# Patient Record
Sex: Female | Born: 2000 | Race: Black or African American | Hispanic: No | Marital: Single | Smoking: Never smoker
Health system: Southern US, Community
[De-identification: ages and names within clinical notes are randomized; demographics above are authoritative.]

## PROBLEM LIST (undated history)

## (undated) DIAGNOSIS — F32A Depression, unspecified: Secondary | ICD-10-CM

## (undated) DIAGNOSIS — K3184 Gastroparesis: Secondary | ICD-10-CM

## (undated) DIAGNOSIS — F410 Panic disorder [episodic paroxysmal anxiety] without agoraphobia: Secondary | ICD-10-CM

## (undated) DIAGNOSIS — F329 Major depressive disorder, single episode, unspecified: Secondary | ICD-10-CM

## (undated) DIAGNOSIS — L309 Dermatitis, unspecified: Secondary | ICD-10-CM

## (undated) DIAGNOSIS — R51 Headache: Secondary | ICD-10-CM

## (undated) DIAGNOSIS — T7840XA Allergy, unspecified, initial encounter: Secondary | ICD-10-CM

## (undated) HISTORY — DX: Dermatitis, unspecified: L30.9

## (undated) HISTORY — PX: NO PAST SURGERIES: SHX2092

## (undated) HISTORY — DX: Headache: R51

## (undated) HISTORY — DX: Gastroparesis: K31.84

---
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
## 2018-02-07 ENCOUNTER — Encounter (HOSPITAL_COMMUNITY): Payer: Self-pay | Admitting: Emergency Medicine

## 2018-02-07 ENCOUNTER — Other Ambulatory Visit: Payer: Self-pay

## 2018-02-07 ENCOUNTER — Emergency Department (HOSPITAL_COMMUNITY)
Admission: EM | Admit: 2018-02-07 | Discharge: 2018-02-07 | Disposition: A | Discharge status: Home / Self Care | Payer: Medicaid Other | Attending: Emergency Medicine | Admitting: Emergency Medicine

## 2018-02-07 DIAGNOSIS — Z7722 Contact with and (suspected) exposure to environmental tobacco smoke (acute) (chronic): Secondary | ICD-10-CM | POA: Insufficient documentation

## 2018-02-07 DIAGNOSIS — Z79899 Other long term (current) drug therapy: Secondary | ICD-10-CM | POA: Diagnosis not present

## 2018-02-07 DIAGNOSIS — R51 Headache: Secondary | ICD-10-CM | POA: Insufficient documentation
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
## 2018-02-07 NOTE — ED Notes (Signed)
Pt's boyfriend was asked to step out of the room so the Dr could interview the patient because he was answering all the questions. Pt and the boyfriend both decided to leave.

## 2018-02-07 NOTE — ED Provider Notes (Deleted)
[facility]-EMERGENCY DEPT Provider Note: Lowella Dell, MD, FACEP  CSN: [id number] MRN: [id number] ARRIVAL: 02/07/18 at 2056 ROOM: WA18/WA18   CHIEF COMPLAINT  Head Injury and Hand Injury   HISTORY OF PRESENT ILLNESS  02/07/18 11:29 PM [name] is a 17 y.o. female    Past Medical History:  Diagnosis Date  . Allergy   . Depression   . Headache(784.0)   . Panic attack     Past Surgical History:  Procedure Laterality Date  . NO PAST SURGERIES      Family History  Problem Relation Age of Onset  . Migraines Mother   . Migraines Maternal Grandmother     Social History   Tobacco Use  . Smoking status: Passive Smoke Exposure - Never Smoker  . Smokeless tobacco: Never Used  Substance Use Topics  . Alcohol use: No  . Drug use: Yes    Types: Marijuana    Comment: last used 09/31/2018    Prior to Admission medications   Medication Sig Start Date End Date Taking? Authorizing Provider  cyclobenzaprine (FLEXERIL) 10 MG tablet Take 1 tablet (10 mg total) by mouth 2 (two) times daily as needed for muscle spasms. 12/04/17  Yes Derwood Kaplan, MD  hydrOXYzine (ATARAX/VISTARIL) 25 MG tablet Take 25 mg by mouth daily as needed (sleep).   Yes [provider]  ibuprofen (ADVIL,MOTRIN) 400 MG tablet Take 1 tablet (400 mg total) by mouth every 6 (six) hours as needed. Patient not taking: Reported on 02/07/2018 12/04/17   Derwood Kaplan, MD  nitrofurantoin, macrocrystal-monohydrate, (MACROBID) 100 MG capsule Take 1 capsule (100 mg total) by mouth 2 (two) times daily. Patient not taking: Reported on 02/07/2018 12/04/17   Derwood Kaplan, MD  phenazopyridine (PYRIDIUM) 200 MG tablet Take 1 tablet (200 mg total) by mouth 3 (three) times daily. Patient not taking: Reported on 02/07/2018 12/04/17   Derwood Kaplan, MD  QUEtiapine (SEROQUEL) 100 MG tablet Take 1 tablet (100 mg total) by mouth at bedtime. Patient not taking: Reported on 02/07/2018 04/16/15   Thedora Hinders,  MD  valACYclovir (VALTREX) 1000 MG tablet Take 1 tablet (1,000 mg total) by mouth 2 (two) times daily. Patient not taking: Reported on 02/07/2018 03/20/17   Thressa Sheller D, CNM    Allergies Latex   REVIEW OF SYSTEMS  Negative except as noted here or in the History of Present Illness.   PHYSICAL EXAMINATION  Initial Vital Signs Blood pressure (!) 132/105, pulse 71, temperature 98.2 F (36.8 C), temperature source Oral, resp. rate 17, last menstrual period 02/02/2018, SpO2 100 %.  Examination General: Well-developed, well-nourished female in no acute distress; appearance consistent with age of record HENT: normocephalic; atraumatic Eyes: pupils equal, round and reactive to light; extraocular muscles intact Neck: supple Heart: regular rate and rhythm; no murmurs, rubs or gallops Lungs: clear to auscultation bilaterally Abdomen: soft; nondistended; nontender; no masses or hepatosplenomegaly; bowel sounds present Extremities: No deformity; full range of motion; pulses normal Neurologic: Awake, alert and oriented; motor function intact in all extremities and symmetric; no facial droop Skin: Warm and dry Psychiatric: Normal mood and affect   RESULTS  Summary of this visit's results, reviewed by myself:   EKG Interpretation  Date/Time:    Ventricular Rate:    PR Interval:    QRS Duration:   QT Interval:    QTC Calculation:   R Axis:     Text Interpretation:        Laboratory Studies: No results found for this  or any previous visit (from the past 24 hour(s)). Imaging Studies: No results found.  ED COURSE and MDM  Nursing notes and initial vitals signs, including pulse oximetry, reviewed.  Vitals:   02/07/18 2150  BP: (!) 132/105  Pulse: 71  Resp: 17  Temp: 98.2 F (36.8 C)  TempSrc: Oral  SpO2: 100%    PROCEDURES    ED DIAGNOSES  No diagnosis found.     [name], Shawnie Nicole, MD 02/07/18 [phone]

## 2018-02-09 ENCOUNTER — Emergency Department (HOSPITAL_COMMUNITY)
Admission: EM | Admit: 2018-02-09 | Discharge: 2018-02-09 | Disposition: A | Discharge status: Home / Self Care | Payer: Medicaid Other | Attending: Emergency Medicine | Admitting: Emergency Medicine

## 2018-02-09 ENCOUNTER — Emergency Department (HOSPITAL_COMMUNITY): Payer: Medicaid Other

## 2018-02-09 ENCOUNTER — Encounter (HOSPITAL_COMMUNITY): Payer: Self-pay | Admitting: Emergency Medicine

## 2018-02-09 DIAGNOSIS — Z91040 Latex allergy status: Secondary | ICD-10-CM | POA: Diagnosis not present

## 2018-02-09 DIAGNOSIS — Z7722 Contact with and (suspected) exposure to environmental tobacco smoke (acute) (chronic): Secondary | ICD-10-CM | POA: Diagnosis not present

## 2018-02-09 DIAGNOSIS — S060X0A Concussion without loss of consciousness, initial encounter: Secondary | ICD-10-CM

## 2018-02-09 DIAGNOSIS — R51 Headache: Secondary | ICD-10-CM | POA: Insufficient documentation

## 2018-02-09 MED ORDER — KETOROLAC TROMETHAMINE 30 MG/ML IJ SOLN
15.0000 mg | Freq: Once | INTRAMUSCULAR | Status: AC
  Administered 2018-02-09: 15 mg via INTRAMUSCULAR
  Filled 2018-02-09: qty 1
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
## 2018-07-01 ENCOUNTER — Other Ambulatory Visit: Payer: Self-pay

## 2018-07-01 ENCOUNTER — Emergency Department (HOSPITAL_COMMUNITY)
Admission: EM | Admit: 2018-07-01 | Discharge: 2018-07-02 | Disposition: A | Discharge status: Home / Self Care | Payer: Medicaid Other | Attending: Emergency Medicine | Admitting: Emergency Medicine

## 2018-07-01 ENCOUNTER — Encounter (HOSPITAL_COMMUNITY): Payer: Self-pay | Admitting: Emergency Medicine

## 2018-07-01 DIAGNOSIS — Z7722 Contact with and (suspected) exposure to environmental tobacco smoke (acute) (chronic): Secondary | ICD-10-CM | POA: Insufficient documentation

## 2018-07-01 DIAGNOSIS — R45850 Homicidal ideations: Secondary | ICD-10-CM | POA: Diagnosis not present

## 2018-07-01 DIAGNOSIS — F4325 Adjustment disorder with mixed disturbance of emotions and conduct: Secondary | ICD-10-CM | POA: Diagnosis present

## 2018-07-01 DIAGNOSIS — F919 Conduct disorder, unspecified: Secondary | ICD-10-CM | POA: Diagnosis not present

## 2018-07-01 DIAGNOSIS — F314 Bipolar disorder, current episode depressed, severe, without psychotic features: Secondary | ICD-10-CM | POA: Insufficient documentation

## 2018-07-01 DIAGNOSIS — Z7289 Other problems related to lifestyle: Secondary | ICD-10-CM | POA: Diagnosis not present

## 2018-07-01 DIAGNOSIS — R4689 Other symptoms and signs involving appearance and behavior: Secondary | ICD-10-CM

## 2018-07-01 LAB — COMPREHENSIVE METABOLIC PANEL
ALBUMIN: 5.1 g/dL — AB (ref 3.5–5.0)
ALT: 15 U/L (ref 0–44)
AST: 21 U/L (ref 15–41)
Alkaline Phosphatase: 55 U/L (ref 38–126)
Anion gap: 9 (ref 5–15)
BUN: 12 mg/dL (ref 6–20)
CHLORIDE: 108 mmol/L (ref 98–111)
CO2: 24 mmol/L (ref 22–32)
CREATININE: 0.72 mg/dL (ref 0.44–1.00)
Calcium: 9.6 mg/dL (ref 8.9–10.3)
GFR calc Af Amer: >60 mL/min (ref >60)
Glucose, Bld: 96 mg/dL (ref 70–99)
POTASSIUM: 3.6 mmol/L (ref 3.5–5.1)
SODIUM: 141 mmol/L (ref 135–145)
Total Bilirubin: 0.7 mg/dL (ref 0.3–1.2)
Total Protein: 8.3 g/dL — ABNORMAL HIGH (ref 6.5–8.1)

## 2018-07-01 LAB — I-STAT BETA HCG BLOOD, ED (MC, WL, AP ONLY): I-stat hCG, quantitative: <5 m[IU]/mL (ref <5)

## 2018-07-01 LAB — CBC
HEMATOCRIT: 39.4 % (ref 36.0–46.0)
HEMOGLOBIN: 13.2 g/dL (ref 12.0–15.0)
MCH: 24.3 pg — AB (ref 26.0–34.0)
MCHC: 33.5 g/dL (ref 30.0–36.0)
MCV: 72.4 fL — AB (ref 80.0–100.0)
Platelets: 290 10*3/uL (ref 150–400)
RBC: 5.44 MIL/uL — ABNORMAL HIGH (ref 3.87–5.11)
RDW: 17 % — ABNORMAL HIGH (ref 11.5–15.5)
WBC: 7.7 10*3/uL (ref 4.0–10.5)
nRBC: 0 % (ref 0.0–0.2)

## 2018-07-01 MED ORDER — HYDROXYZINE HCL 25 MG PO TABS
50.0000 mg | ORAL_TABLET | Freq: Every day | ORAL | Status: DC
  Administered 2018-07-01: 50 mg via ORAL
  Filled 2018-07-01: qty 2

## 2018-07-01 NOTE — BH Assessment (Addendum)
Assessment Note  Nina [name] is an 18 y.o. female that presents this date with IVC. Per IVC: "Respondent has been diagnosed with Bipolar Disorder. Respondent has ben prescribed medications but is currently non compliant. Respondent took pills yesterday in a attempt to overdose and attempted to run into traffic. Respondent is a danger to herself and others." Patient admits to S/I this date reporting she attempted self harm yesterday 06/30/18 by taking medications. Patient will not elaborate on what medications she ingested or where she obtained those medications. Patient is observed to be angry and speaks in a loud voice as this Clinical research associate attempts to assess. Patient denies any H/I or AVH. Patient denies currently having a OP provider or being on any medications at this time. Patient reports the last time she was prescribed medications was in 2018 when she was receiving services from [facility]. Patient denies any current mental health diagnosis or symptoms. Per note review patient was last assessed in 2018 at [facility] when at that time her legal guardian was her grandmother Nina [name] [phone]. Patient states this date her she no longer has a guardian. Per notes patient has a history of PTSD although denies this date. This Clinical research associate attempted to contact Nina [name] this date unsuccessfully. Patient denies any SA history although per chart review has a history of cocaine and opiate use. Patient's UDS is pending this date. Patient is oriented x 4 and provides limited history.  Information to complete assessment was obtained from admission notes and history. Per notes patient has a history of bipolar disorder presenting under IVC. Patient reports she does not know why she is here, and that IVC paperwork has been taken out inappropriately on her. Patient reports she has not slept in the last couple days, but otherwise denies any problems with sleep. When asked if she has any thoughts of self-harm or has  attempted self-harm she states "I am just going to tell you what ever that makes me get out of here the quickest". She denies hitting her head or attempting to run out in traffic. Case was staffed with Nina Pollack DNP who recommended a inpatient admission to assist with stabilization.     Diagnosis: F31.4 Bipolar I disorder, current or most recent episode depressed (per notes)   Past Medical History:  Past Medical History:  Diagnosis Date  . Allergy   . Depression   . Headache(784.0)   . Panic attack     Past Surgical History:  Procedure Laterality Date  . NO PAST SURGERIES      Family History:  Family History  Problem Relation Age of Onset  . Migraines Mother   . Migraines Maternal Grandmother     Social History:  reports that she is a non-smoker but has been exposed to tobacco smoke. She has never used smokeless tobacco. She reports current drug use. Drug: Marijuana. She reports that she does not drink alcohol.  Additional Social History:  Alcohol / Drug Use Pain Medications: see MAR Prescriptions: see MAR Over the Counter: see MAR History of alcohol / drug use?: Yes Longest period of sobriety (when/how long): Unknown  Negative Consequences of Use: (Pt denies) Withdrawal Symptoms: (Pt denies) Substance #1 Name of Substance 1: Cocaine per hx 1 - Age of First Use: 17 1 - Amount (size/oz): Pt denies current use 1 - Frequency: Pt denies current use 1 - Duration: Pt denies current use 1 - Last Use / Amount: Pt denies current use UDS pending  CIWA: CIWA-Ar BP: Marland Kitchen)  122/92 Pulse Rate: 66 COWS:    Allergies:  Allergies  Allergen Reactions  . Latex Rash    Home Medications: (Not in a hospital admission)   OB/GYN Status:  No LMP recorded.  General Assessment Data Location of Assessment: [facility] ED TTS Assessment: In system Is this a Tele or Face-to-Face Assessment?: Face-to-Face Is this an Initial Assessment or a Re-assessment for this encounter?: Initial Assessment Patient  Accompanied by:: N/A Language Other than English: No Living Arrangements: Other (Comment)(Alone) What gender do you identify as?: Female Marital status: Single Maiden name: Sisemore Pregnancy Status: No Living Arrangements: Alone Can pt return to current living arrangement?: Yes Admission Status: Involuntary Petitioner: Family member Is patient capable of signing voluntary admission?: Yes Referral Source: Self/Family/Friend Insurance type: Medicaid     Crisis Care Plan Living Arrangements: Alone Legal Guardian: (NA) Name of Psychiatrist: Pt denies Name of Therapist: None   Education Status Is patient currently in school?: No Is the patient employed, unemployed or receiving disability?: Unemployed  Risk to self with the past 6 months Suicidal Ideation: Yes-Currently Present Has patient been a risk to self within the past 6 months prior to admission? : No Suicidal Intent: Yes-Currently Present Has patient had any suicidal intent within the past 6 months prior to admission? : No Is patient at risk for suicide?: Yes Suicidal Plan?: (Pt denies) Has patient had any suicidal plan within the past 6 months prior to admission? : No Access to Means: (Pt denies) What has been your use of drugs/alcohol within the last 12 months?: Denies current use Previous Attempts/Gestures: No(Per chart) How many times?: 0(Per chart) Other Self Harm Risks: (Non compliant with medications ) Triggers for Past Attempts: Unknown Intentional Self Injurious Behavior: None Family Suicide History: No Recent stressful life event(s): Other (Comment)(Homeless) Persecutory voices/beliefs?: No Depression: (Pt denies) Depression Symptoms: (Pt denies) Substance abuse history and/or treatment for substance abuse?: No Suicide prevention information given to non-admitted patients: Not applicable  Risk to Others within the past 6 months Homicidal Ideation: No Does patient have any lifetime risk of violence toward  others beyond the six months prior to admission? : No Thoughts of Harm to Others: No Current Homicidal Intent: No Current Homicidal Plan: No Access to Homicidal Means: No Identified Victim: NA History of harm to others?: No Assessment of Violence: None Noted Violent Behavior Description: NA Does patient have access to weapons?: No Criminal Charges Pending?: No Does patient have a court date: No Is patient on probation?: No  Psychosis Hallucinations: None noted Delusions: None noted  Mental Status Report Appearance/Hygiene: In scrubs Eye Contact: Fair Motor Activity: Freedom of movement Speech: Pressured, Loud Level of Consciousness: Irritable Mood: Angry Affect: Threatening Anxiety Level: Moderate Thought Processes: Coherent, Relevant Judgement: Partial Orientation: Person, Place, Time, Situation Obsessive Compulsive Thoughts/Behaviors: None  Cognitive Functioning Concentration: Decreased Memory: Recent Intact, Remote Intact Is patient IDD: No Insight: Poor Impulse Control: Poor Appetite: Fair Have you had any weight changes? : No Change Sleep: No Change Total Hours of Sleep: 6 Vegetative Symptoms: None  ADLScreening [facility] Assessment Services) Patient's cognitive ability adequate to safely complete daily activities?: Yes Patient able to express need for assistance with ADLs?: Yes Independently performs ADLs?: Yes (appropriate for developmental age)  Prior Inpatient Therapy Prior Inpatient Therapy: Yes Prior Therapy Dates: 2018  Prior Therapy Facilty/Provider(s[facility] Reason for Treatment: MH issues  Prior Outpatient Therapy Prior Outpatient Therapy: Yes Prior Therapy Dates: 2018 Prior Therapy Facilty/Provider(s): [facility] Reason for Treatment: Med mang Does patient have  an ACCT team?: No Does patient have Intensive In-House Services?  : No Does patient have [facility] services? : No Does patient have P4CC services?: No  ADL Screening (condition at time  of admission) Patient's cognitive ability adequate to safely complete daily activities?: Yes Is the patient deaf or have difficulty hearing?: No Does the patient have difficulty seeing, even when wearing glasses/contacts?: No Does the patient have difficulty concentrating, remembering, or making decisions?: No Patient able to express need for assistance with ADLs?: Yes Does the patient have difficulty dressing or bathing?: No Independently performs ADLs?: Yes (appropriate for developmental age) Does the patient have difficulty walking or climbing stairs?: No Weakness of Legs: None Weakness of Arms/Hands: None  Home Assistive Devices/Equipment Home Assistive Devices/Equipment: None  Therapy Consults (therapy consults require a physician order) PT Evaluation Needed: No OT Evalulation Needed: No SLP Evaluation Needed: No Abuse/Neglect Assessment (Assessment to be complete while patient is alone) Physical Abuse: Denies Verbal Abuse: Denies Sexual Abuse: Denies Exploitation of patient/patient's resources: Denies Self-Neglect: Denies Values / Beliefs Cultural Requests During Hospitalization: None Spiritual Requests During Hospitalization: None Consults Spiritual Care Consult Needed: No Social Work Consult Needed: No Merchant navy officerAdvance Directives (For Healthcare) Does Patient Have a Medical Advance Directive?: No Would patient like information on creating a medical advance directive?: No - Patient declined          Disposition: Case was staffed with Nina PollackLord DNP who recommended a inpatient admission to assist with stabilization. Disposition Initial Assessment Completed for this Encounter: Yes Disposition of Patient: Admit Type of inpatient treatment program: Adult Patient refused recommended treatment: Yes Type of treatment offered and refused: In-patient Mode of transportation if patient is discharged/movement?: Loreli Slot(UNK)  On Site Evaluation by:   Reviewed with Physician:    Alfredia Fergusonavid L  Soyla Bainter 07/01/2018 6:23 PM

## 2018-07-01 NOTE — ED Triage Notes (Signed)
Pt BIB police IVC d related to taking pills yesterday and running out in front of traffic today; noncompliant with medications; hx of bipolar disorder and depression.

## 2018-07-01 NOTE — ED Provider Notes (Signed)
[facility]-EMERGENCY DEPT Provider Note   CSN: [id number] Arrival date & time: 07/01/18  1540     History   Chief Complaint Chief Complaint  Patient presents with  . IVC    HPI Nina [name] is a 18 y.o. female.  HPI   Patient is a 18 year old female with a history of bipolar disorder, headaches, PTSD presenting under IVC.  Patient reports she does not know why she is here, and that IVC paperwork has been taken out inappropriately on her.  She reports that she does not know her grandmother knew about "information she confided in someone with", but states that her grandmother is the one that took out the IVC paperwork.  She reports that she took 100 mg of hydroxyzine at bedtime 2 nights ago, whereas her typical dose is 50 mg of hydroxyzine as needed at night.  Patient reports she has not slept in the last couple days, but otherwise denies any problems with sleep.  When asked if she has any thoughts of self-harm or has attempted self-harm she states "I am just cannot tell you what ever skin to make me get out here the quickest".  She denies hitting her head or attempting to run out in traffic.  She denies AVH.  She reports that she smokes marijuana but denies any tobacco use, alcohol use, or illicit drug use.  Patient reports she has a psychiatrist named Dr. Shela CommonsJ in [place].  She denies taking her Seroquel in 2 years.  Past Medical History:  Diagnosis Date  . Allergy   . Depression   . Headache(784.0)   . Panic attack     Patient Active Problem List   Diagnosis Date Noted  . Bipolar disorder (HCC) 04/10/2015  . ODD (oppositional defiant disorder) 01/26/2014  . PTSD (post-traumatic stress disorder) 01/22/2014  . MDD (major depressive disorder), recurrent episode, severe (HCC) 01/22/2014  . Homicidal ideation 01/22/2014  . Tension headache 11/06/2012  . Migraine headache without aura 11/06/2012  . Insomnia 11/06/2012    Past Surgical History:  Procedure  Laterality Date  . NO PAST SURGERIES       OB History   No obstetric history on file.      Home Medications    Prior to Admission medications   Medication Sig Start Date End Date Taking? Authorizing Provider  cyclobenzaprine (FLEXERIL) 10 MG tablet Take 1 tablet (10 mg total) by mouth 2 (two) times daily as needed for muscle spasms. Patient not taking: Reported on 02/09/2018 12/04/17   Derwood KaplanNanavati, Ankit, MD  ibuprofen (ADVIL,MOTRIN) 400 MG tablet Take 1 tablet (400 mg total) by mouth every 6 (six) hours as needed. Patient not taking: Reported on 02/07/2018 12/04/17   Derwood KaplanNanavati, Ankit, MD  nitrofurantoin, macrocrystal-monohydrate, (MACROBID) 100 MG capsule Take 1 capsule (100 mg total) by mouth 2 (two) times daily. Patient not taking: Reported on 02/07/2018 12/04/17   Derwood KaplanNanavati, Ankit, MD  phenazopyridine (PYRIDIUM) 200 MG tablet Take 1 tablet (200 mg total) by mouth 3 (three) times daily. Patient not taking: Reported on 02/07/2018 12/04/17   Derwood KaplanNanavati, Ankit, MD  QUEtiapine (SEROQUEL) 100 MG tablet Take 1 tablet (100 mg total) by mouth at bedtime. Patient not taking: Reported on 02/07/2018 04/16/15   Thedora HindersSevilla Saez-Benito, Miriam, MD  valACYclovir (VALTREX) 1000 MG tablet Take 1 tablet (1,000 mg total) by mouth 2 (two) times daily. Patient not taking: Reported on 02/07/2018 03/20/17   Armando ReichertHogan, Heather D, CNM    Family History Family History  Problem Relation  Age of Onset  . Migraines Mother   . Migraines Maternal Grandmother     Social History Social History   Tobacco Use  . Smoking status: Passive Smoke Exposure - Never Smoker  . Smokeless tobacco: Never Used  Substance Use Topics  . Alcohol use: No  . Drug use: Yes    Types: Marijuana    Comment: last used 09/31/2018     Allergies   Latex   Review of Systems Review of Systems  Musculoskeletal: Negative for arthralgias.  Skin: Negative for wound.  Psychiatric/Behavioral: Positive for agitation and sleep disturbance. Negative for  dysphoric mood, hallucinations, self-injury and suicidal ideas. The patient is not nervous/anxious.   All other systems reviewed and are negative.    Physical Exam Updated Vital Signs BP (!) 122/92 (BP Location: Right Arm)   Pulse 66   Temp 98.2 F (36.8 C) (Oral)   Resp 19   SpO2 100%   Physical Exam Vitals signs and nursing note reviewed.  Constitutional:      General: She is not in acute distress.    Appearance: She is well-developed. She is not ill-appearing.  HENT:     Head: Normocephalic and atraumatic.     Comments: No evidence of trauma on forehead.  No battle sign.    Nose: Nose normal.  Eyes:     Extraocular Movements: Extraocular movements intact.     Conjunctiva/sclera: Conjunctivae normal.     Pupils: Pupils are equal, round, and reactive to light.  Neck:     Musculoskeletal: Normal range of motion and neck supple.  Cardiovascular:     Rate and Rhythm: Normal rate and regular rhythm.     Heart sounds: S1 normal and S2 normal. No murmur.  Pulmonary:     Effort: Pulmonary effort is normal.     Breath sounds: Normal breath sounds. No wheezing or rales.  Abdominal:     General: There is no distension.     Palpations: Abdomen is soft.     Tenderness: There is no abdominal tenderness. There is no guarding.  Musculoskeletal: Normal range of motion.        General: No deformity.  Lymphadenopathy:     Cervical: No cervical adenopathy.  Skin:    General: Skin is warm and dry.     Findings: No erythema or rash.  Neurological:     Mental Status: She is alert.     Comments: Cranial nerves grossly intact. Patient moves extremities symmetrically and with good coordination.  Psychiatric:     Comments: Alert and oriented x4.  Labile affect.  Appears agitated at times.  Normal speech.  Overall appropriate thought content and judgment, however begins to speak rapidly when informed that she would need to say overnight.       ED Treatments / Results  Labs (all labs  ordered are listed, but only abnormal results are displayed) Labs Reviewed  COMPREHENSIVE METABOLIC PANEL - Abnormal; Notable for the following components:      Result Value   Total Protein 8.3 (*)    Albumin 5.1 (*)    All other components within normal limits  CBC - Abnormal; Notable for the following components:   RBC 5.44 (*)    MCV 72.4 (*)    MCH 24.3 (*)    RDW 17.0 (*)    All other components within normal limits  ETHANOL  SALICYLATE LEVEL  ACETAMINOPHEN LEVEL  RAPID URINE DRUG SCREEN, HOSP PERFORMED  I-STAT BETA HCG BLOOD, ED ([facility],  [facility], [facility] ONLY)    EKG None  Radiology No results found.  Procedures Procedures (including critical care time)  Medications Ordered in ED Medications  hydrOXYzine (ATARAX/VISTARIL) tablet 50 mg (has no administration in time range)     Initial Impression / Assessment and Plan / ED Course  I have reviewed the triage vital signs and the nursing notes.  Pertinent labs & imaging results that were available during my care of the patient were reviewed by me and considered in my medical decision making (see chart for details).  Clinical Course as of Jul 01 1900  Sheral Flow Jul 01, 2018  1725 Attempted to reach patient's grandmother for collateral information. Unable to reach. Will re-attempt.    [AM]  1859 Unable to reach grandmother for collateral information x2.   [AM]  1859 Total Protein(!): 8.3 [AM]    Clinical Course User Index [AM] Elisha Ponder, PA-C   Patient nontoxic-appearing, overall cooperative and in no acute distress.  Patient under IVC. Patient does exhibit rapid speech at times, question mania.  Patient is largely uncooperative during examination.  An attempt was made to contact patient's grandmother for more collateral information.  Will complete first exam with attending physician.   The IVC report has statement that patient was attempting to run onto traffic, hitting her head on the wall, possibly with "bleeding to the  forehead".  This does not correlate on exam.  Attempted to contact patient's grandmother x2 to get collateral information.  Patient is neurologically intact here today.  Do not feel that intracranial hemorrhage likely.  5:32 PM Patient cleared to speak with TTS at this time. Labs pending.  Lab work demonstrating hyperproteinemia of 8.3.  MCV is 72.4, normal hemoglobin.  Consult to case management placed to assist with follow-up at [facility].  7:22 PM Recommendation per Shaune Pollack, FNP is inpatient. Appreciate their involvement.   Final Clinical Impressions(s) / ED Diagnoses   Final diagnoses:  Aggressive behavior  Self-injurious behavior    ED Discharge Orders    None       Delia Chimes 07/01/18 Reece Agar, MD 07/01/18 2303

## 2018-07-01 NOTE — BH Assessment (Signed)
[facility] Assessment Progress Note Case was staffed with Lord DNP who recommended a inpatient admission to assist with stabilization.       

## 2018-07-01 NOTE — ED Notes (Signed)
Pt changed into purple scrubs. Will not allow any earrings to be removed. Stated, "You will have to knock me out to take those out."

## 2018-07-01 NOTE — ED Notes (Signed)
Pt staying in her room. Irritable, but not being a problem. Allowed blood draw.  Waiting for urine.

## 2018-07-01 NOTE — ED Notes (Signed)
Pt oriented to room and unit.  Pt is calm and cooperative.  She was tearful at first but she opened up pretty quickly.  15 minute checks and video monitoring in place.

## 2018-07-02 DIAGNOSIS — F4325 Adjustment disorder with mixed disturbance of emotions and conduct: Secondary | ICD-10-CM | POA: Diagnosis present

## 2018-07-02 LAB — RAPID URINE DRUG SCREEN, HOSP PERFORMED
AMPHETAMINES: NOT DETECTED
BARBITURATES: NOT DETECTED
BENZODIAZEPINES: NOT DETECTED
Cocaine: NOT DETECTED
Opiates: NOT DETECTED
Tetrahydrocannabinol: POSITIVE — AB

## 2018-07-02 NOTE — ED Notes (Signed)
Patient reminded that a urine sample was needed.  Urine cup at bedside.  Patient said she didn't need to urinate at this time.

## 2018-07-02 NOTE — [facility] Counselor (Signed)
TTS attempted to contact patient's collateral contact, [place] [phone], however she did not pick up and mailbox was full.

## 2018-07-02 NOTE — ED Notes (Signed)
Initially on first contact with this writer she was irritable and requesting something to help her sleep. She has a blanket around her and covering most of her head. Once writer responded to her with an affirmative response, she calmed and was pleasant and cooperative. She took her sleeping medication at 2100 and went to bed where she has been and asleep since shortly after 2100. No behavior issues, will continue to monitor for safety.

## 2018-07-02 NOTE — [facility] Counselor (Signed)
TTS obtained collateral information from patient's [facility] with consent of patient [phone]. Aunt feels that "the whole situation was blown out of proportion and she does not need to be in the hospital." She states that patient and her grandmother are feuding and there may have been some ulterior motive for taking out IVC. She states that patient does struggle with anger and needs to continue therapy and medications, but does not believe she is a danger to herself or others. She believes that some family egg her anger on when she is asking to be left alone. She states that she see patient on a daily basis and stated she could stay with her or call her whenever she needs to.

## 2018-07-02 NOTE — Discharge Instructions (Signed)
For your behavioral health needs you are advised to follow up with [facility].  New patients are seen at their walk-in clinic.  Walk-in hours are Monday - Friday from 8:00 am - 12:00 pm, and from 1:00 pm - 3:00 pm.  Walk-in patients are seen on a first come, first served basis, so try to arrive as early as possible for the best chance of being seen the same day.  There is an initial fee of $22.50: ° °     [facility] °     [address] °     [place], [place] [address] °     [phone] °

## 2018-07-02 NOTE — ED Notes (Signed)
Patient now talking quietly on the phone asking if she is going home or not.
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
## 2018-07-02 NOTE — ED Notes (Addendum)
Patient awake, alert walking around the unit calm and cooperative.  No bizarre behavior noted.

## 2018-07-02 NOTE — BH Assessment (Signed)
[facility] Assessment Progress Note  Per Juanetta Beets, DO, this pt does not require psychiatric hospitalization at this time.  Pt presents under IVC initiated by pt's grandmother, which Dr Sharma Covert has rescinded.  Pt is to be discharged from [facility] with recommendation to follow up with [facility].  This has been included in pt's discharge instructions.  Pt's nurse, Aram Beecham, has been notified.  Doylene Canning, MA Triage Specialist [phone]
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
## 2018-07-03 ENCOUNTER — Telehealth: Payer: Self-pay | Admitting: *Deleted

## 2018-07-03 NOTE — Telephone Encounter (Signed)
ED CM attempted to reach the patient to ensure she has a f/u appointment with a PCP. Unable to reach the patient. A woman answered and stated she will give Nina [name] the telephone number and have her return the call. Rubie Maidrystal Aliya Sol RN CCM

## 2018-12-07 ENCOUNTER — Other Ambulatory Visit: Payer: Self-pay

## 2018-12-07 ENCOUNTER — Emergency Department (HOSPITAL_COMMUNITY)
Admission: EM | Admit: 2018-12-07 | Discharge: 2018-12-07 | Disposition: A | Discharge status: Home / Self Care | Payer: Medicaid Other | Attending: Emergency Medicine | Admitting: Emergency Medicine

## 2018-12-07 DIAGNOSIS — Z3A01 Less than 8 weeks gestation of pregnancy: Secondary | ICD-10-CM | POA: Insufficient documentation

## 2018-12-07 DIAGNOSIS — Z91040 Latex allergy status: Secondary | ICD-10-CM | POA: Diagnosis not present

## 2018-12-07 DIAGNOSIS — O26891 Other specified pregnancy related conditions, first trimester: Secondary | ICD-10-CM | POA: Insufficient documentation

## 2018-12-07 DIAGNOSIS — N898 Other specified noninflammatory disorders of vagina: Secondary | ICD-10-CM | POA: Insufficient documentation

## 2018-12-07 DIAGNOSIS — Y92488 Other paved roadways as the place of occurrence of the external cause: Secondary | ICD-10-CM | POA: Insufficient documentation

## 2018-12-07 DIAGNOSIS — F1210 Cannabis abuse, uncomplicated: Secondary | ICD-10-CM | POA: Insufficient documentation

## 2018-12-07 DIAGNOSIS — Y9389 Activity, other specified: Secondary | ICD-10-CM | POA: Insufficient documentation

## 2018-12-07 DIAGNOSIS — Z7722 Contact with and (suspected) exposure to environmental tobacco smoke (acute) (chronic): Secondary | ICD-10-CM | POA: Diagnosis not present

## 2018-12-07 DIAGNOSIS — R1032 Left lower quadrant pain: Secondary | ICD-10-CM | POA: Diagnosis not present

## 2018-12-07 DIAGNOSIS — Y998 Other external cause status: Secondary | ICD-10-CM | POA: Diagnosis not present

## 2018-12-07 LAB — I-STAT BETA HCG BLOOD, ED (MC, WL, AP ONLY): I-stat hCG, quantitative: >2000 m[IU]/mL — ABNORMAL HIGH (ref <5)

## 2018-12-07 LAB — WET PREP, GENITAL
Clue Cells Wet Prep HPF POC: NONE SEEN
Sperm: NONE SEEN
Trich, Wet Prep: NONE SEEN
Yeast Wet Prep HPF POC: NONE SEEN

## 2018-12-07 LAB — URINALYSIS, ROUTINE W REFLEX MICROSCOPIC
Bilirubin Urine: NEGATIVE
Glucose, UA: NEGATIVE mg/dL
Hgb urine dipstick: NEGATIVE
Ketones, ur: 20 mg/dL — AB
Leukocytes,Ua: NEGATIVE
Nitrite: NEGATIVE
Protein, ur: NEGATIVE mg/dL
Specific Gravity, Urine: 1.02 (ref 1.005–1.030)
pH: 6 (ref 5.0–8.0)
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
## 2018-12-07 NOTE — ED Notes (Signed)
Patient verbalizes understanding of discharge instructions. Opportunity for questioning and answers were provided. Armband removed by staff, pt discharged from ED. Ambulated out to lobby  
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
## 2018-12-07 NOTE — Discharge Instructions (Signed)
Your urine is without infection, your wet prep without BV. Follow up with your OBGYN.  Tylenol for pain.    Return for vaginal bleeding or worsening pain.    WEAR YOUR SEATBELT!

## 2018-12-09 LAB — GC/CHLAMYDIA PROBE AMP (~~LOC~~) NOT AT ARMC
Chlamydia: POSITIVE — AB
Neisseria Gonorrhea: NEGATIVE

## 2019-03-15 ENCOUNTER — Encounter (HOSPITAL_COMMUNITY): Payer: Self-pay

## 2019-03-15 ENCOUNTER — Encounter (HOSPITAL_COMMUNITY): Payer: Self-pay | Admitting: Emergency Medicine

## 2019-03-15 ENCOUNTER — Inpatient Hospital Stay (HOSPITAL_COMMUNITY)
Admission: AD | Admit: 2019-03-15 | Discharge: 2019-03-15 | Discharge status: Against Medical Advice | Payer: Medicaid Other | Source: Home / Self Care | Attending: Obstetrics & Gynecology | Admitting: Obstetrics & Gynecology

## 2019-03-15 ENCOUNTER — Other Ambulatory Visit: Payer: Self-pay

## 2019-03-15 ENCOUNTER — Emergency Department (HOSPITAL_COMMUNITY)
Admission: EM | Admit: 2019-03-15 | Discharge: 2019-03-15 | Disposition: A | Discharge status: Home / Self Care | Payer: Medicaid Other | Attending: Emergency Medicine | Admitting: Emergency Medicine

## 2019-03-15 ENCOUNTER — Emergency Department (HOSPITAL_COMMUNITY): Payer: Medicaid Other

## 2019-03-15 DIAGNOSIS — Z3A20 20 weeks gestation of pregnancy: Secondary | ICD-10-CM | POA: Insufficient documentation

## 2019-03-15 DIAGNOSIS — Z7722 Contact with and (suspected) exposure to environmental tobacco smoke (acute) (chronic): Secondary | ICD-10-CM | POA: Insufficient documentation

## 2019-03-15 DIAGNOSIS — Y999 Unspecified external cause status: Secondary | ICD-10-CM | POA: Insufficient documentation

## 2019-03-15 DIAGNOSIS — S63632A Sprain of interphalangeal joint of right middle finger, initial encounter: Secondary | ICD-10-CM | POA: Diagnosis not present

## 2019-03-15 DIAGNOSIS — T07XXXA Unspecified multiple injuries, initial encounter: Secondary | ICD-10-CM | POA: Diagnosis present

## 2019-03-15 DIAGNOSIS — M545 Low back pain, unspecified: Secondary | ICD-10-CM

## 2019-03-15 DIAGNOSIS — Y939 Activity, unspecified: Secondary | ICD-10-CM | POA: Insufficient documentation

## 2019-03-15 DIAGNOSIS — Y929 Unspecified place or not applicable: Secondary | ICD-10-CM | POA: Insufficient documentation

## 2019-03-15 LAB — URINALYSIS, ROUTINE W REFLEX MICROSCOPIC
Bilirubin Urine: NEGATIVE
Glucose, UA: NEGATIVE mg/dL
Hgb urine dipstick: NEGATIVE
Ketones, ur: 5 mg/dL — AB
Leukocytes,Ua: NEGATIVE
Nitrite: NEGATIVE
Protein, ur: NEGATIVE mg/dL
Specific Gravity, Urine: 1.021 (ref 1.005–1.030)
pH: 6 (ref 5.0–8.0)

## 2019-03-15 NOTE — MAU Note (Signed)
Pt left AMA due to needing to go home and feed her baby. Jorje Guild NP aware. AMA form signed by pt. Pt states,"will follow up with ob doctor regarding this".    Gilmer Mor RN
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
## 2019-03-15 NOTE — ED Notes (Signed)
Transport contacted to get pt to MAU.
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
## 2019-03-15 NOTE — Discharge Instructions (Addendum)
Rest, ice, and buddy tape fingers to restrict movement until pain is less severe Take Tylenol as needed for pain Follow up with your doctor
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
## 2019-03-18 ENCOUNTER — Other Ambulatory Visit: Payer: Self-pay

## 2019-03-18 ENCOUNTER — Inpatient Hospital Stay (HOSPITAL_COMMUNITY)
Admission: AD | Admit: 2019-03-18 | Discharge: 2019-03-18 | Disposition: A | Discharge status: Home / Self Care | Payer: Medicaid Other | Attending: Obstetrics & Gynecology | Admitting: Obstetrics & Gynecology

## 2019-03-18 ENCOUNTER — Encounter (HOSPITAL_COMMUNITY): Payer: Self-pay | Admitting: *Deleted

## 2019-03-18 DIAGNOSIS — Z3492 Encounter for supervision of normal pregnancy, unspecified, second trimester: Secondary | ICD-10-CM

## 2019-03-18 DIAGNOSIS — O26892 Other specified pregnancy related conditions, second trimester: Secondary | ICD-10-CM | POA: Insufficient documentation

## 2019-03-18 DIAGNOSIS — M545 Low back pain: Secondary | ICD-10-CM | POA: Insufficient documentation

## 2019-03-18 DIAGNOSIS — Z3A21 21 weeks gestation of pregnancy: Secondary | ICD-10-CM | POA: Insufficient documentation

## 2019-03-18 DIAGNOSIS — Z7722 Contact with and (suspected) exposure to environmental tobacco smoke (acute) (chronic): Secondary | ICD-10-CM | POA: Diagnosis not present

## 2019-03-18 DIAGNOSIS — R109 Unspecified abdominal pain: Secondary | ICD-10-CM | POA: Insufficient documentation

## 2019-03-18 LAB — URINALYSIS, ROUTINE W REFLEX MICROSCOPIC
Bilirubin Urine: NEGATIVE
Glucose, UA: NEGATIVE mg/dL
Hgb urine dipstick: NEGATIVE
Ketones, ur: NEGATIVE mg/dL
Nitrite: NEGATIVE
Protein, ur: 30 mg/dL — AB
Specific Gravity, Urine: 1.018 (ref 1.005–1.030)
pH: 6 (ref 5.0–8.0)
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
## 2019-03-18 NOTE — Discharge Instructions (Signed)

## 2019-03-19 LAB — CULTURE, OB URINE: Culture: NO GROWTH

## 2019-04-03 ENCOUNTER — Emergency Department (HOSPITAL_COMMUNITY): Payer: Medicaid Other

## 2019-04-03 ENCOUNTER — Other Ambulatory Visit: Payer: Self-pay

## 2019-04-03 ENCOUNTER — Encounter (HOSPITAL_COMMUNITY): Payer: Self-pay

## 2019-04-03 ENCOUNTER — Emergency Department (HOSPITAL_COMMUNITY)
Admission: EM | Admit: 2019-04-03 | Discharge: 2019-04-03 | Disposition: A | Discharge status: Home / Self Care | Payer: Medicaid Other | Attending: Emergency Medicine | Admitting: Emergency Medicine

## 2019-04-03 DIAGNOSIS — Z3A23 23 weeks gestation of pregnancy: Secondary | ICD-10-CM | POA: Diagnosis not present

## 2019-04-03 DIAGNOSIS — Z7722 Contact with and (suspected) exposure to environmental tobacco smoke (acute) (chronic): Secondary | ICD-10-CM | POA: Insufficient documentation

## 2019-04-03 DIAGNOSIS — R0609 Other forms of dyspnea: Secondary | ICD-10-CM | POA: Diagnosis not present

## 2019-04-03 DIAGNOSIS — Z91040 Latex allergy status: Secondary | ICD-10-CM | POA: Insufficient documentation

## 2019-04-03 DIAGNOSIS — Z79899 Other long term (current) drug therapy: Secondary | ICD-10-CM | POA: Insufficient documentation

## 2019-04-03 DIAGNOSIS — O99891 Other specified diseases and conditions complicating pregnancy: Secondary | ICD-10-CM | POA: Insufficient documentation

## 2019-04-03 NOTE — ED Notes (Signed)
Patient transported to X-ray 

## 2019-04-03 NOTE — ED Triage Notes (Signed)
Patient states when she woke this AM around 0900 she began having SOB and feeling like she wanted to cry. Patient breathing normal in triage and texting in triage

## 2019-04-03 NOTE — ED Provider Notes (Signed)
[facility]-EMERGENCY DEPT Provider Note   CSN: [id number] Arrival date & time: 04/03/19  1513     History   Chief Complaint Chief Complaint  Patient presents with  . [redacted] weeks pregnant  . Anxiety  . Shortness of Breath    HPI Nina [name] is a 18 y.o. female.     18 year old female presents with shortness of breath started today.  Patient denies any fever or cough.  Denies severe anxiety.  No exertional chest pain.  No leg pain or swelling.  She denies any vaginal bleeding.  No uterine cramping.  No prior after same.  Symptoms have been persistent since this morning when she woke up.  Nothing makes them better no treatment used prior to arrival.     Past Medical History:  Diagnosis Date  . Allergy   . Depression   . Headache(784.0)   . Panic attack     Patient Active Problem List   Diagnosis Date Noted  . Adjustment disorder with mixed disturbance of emotions and conduct   . Bipolar disorder (HCC) 04/10/2015  . ODD (oppositional defiant disorder) 01/26/2014  . PTSD (post-traumatic stress disorder) 01/22/2014  . MDD (major depressive disorder), recurrent episode, severe (HCC) 01/22/2014  . Homicidal ideation 01/22/2014  . Tension headache 11/06/2012  . Migraine headache without aura 11/06/2012  . Insomnia 11/06/2012    Past Surgical History:  Procedure Laterality Date  . NO PAST SURGERIES       OB History    Gravida  1   Para      Term      Preterm      AB      Living        SAB      TAB      Ectopic      Multiple      Live Births               Home Medications    Prior to Admission medications   Medication Sig Start Date End Date Taking? Authorizing Provider  hydrOXYzine (VISTARIL) 25 MG capsule Take 25-50 mg by mouth at bedtime.    [provider]  prenatal vitamin w/FE, FA (PRENATAL 1 + 1) 27-1 MG TABS tablet Take 1 tablet by mouth daily at 12 noon.    [provider]    Family History  Family History  Problem Relation Age of Onset  . Migraines Mother   . Migraines Maternal Grandmother     Social History Social History   Tobacco Use  . Smoking status: Passive Smoke Exposure - Never Smoker  . Smokeless tobacco: Never Used  Substance Use Topics  . Alcohol use: No  . Drug use: Not Currently    Types: Marijuana    Comment: approx 3 wks ago      Allergies   Latex   Review of Systems Review of Systems  All other systems reviewed and are negative.    Physical Exam Updated Vital Signs BP 127/84 (BP Location: Left Arm)   Pulse 82   Temp 97.8 F (36.6 C) (Oral)   Resp 16   Ht 1.524 m (5')   Wt 56.7 kg   LMP 10/15/2018 (Exact Date)   SpO2 100%   BMI 24.41 kg/m   Physical Exam Vitals signs and nursing note reviewed.  Constitutional:      General: She is not in acute distress.    Appearance: Normal appearance. She is well-developed. She is  not toxic-appearing.  HENT:     Head: Normocephalic and atraumatic.  Eyes:     General: Lids are normal.     Conjunctiva/sclera: Conjunctivae normal.     Pupils: Pupils are equal, round, and reactive to light.  Neck:     Musculoskeletal: Normal range of motion and neck supple.     Thyroid: No thyroid mass.     Trachea: No tracheal deviation.  Cardiovascular:     Rate and Rhythm: Normal rate and regular rhythm.     Heart sounds: Normal heart sounds. No murmur. No gallop.   Pulmonary:     Effort: Pulmonary effort is normal. No respiratory distress.     Breath sounds: Normal breath sounds. No stridor. No decreased breath sounds, wheezing, rhonchi or rales.  Abdominal:     General: Bowel sounds are normal. There is no distension.     Palpations: Abdomen is soft.     Tenderness: There is no abdominal tenderness. There is no rebound.  Musculoskeletal: Normal range of motion.        General: No tenderness.  Skin:    General: Skin is warm and dry.     Findings: No abrasion or rash.  Neurological:     Mental  Status: She is alert and oriented to person, place, and time.     GCS: GCS eye subscore is 4. GCS verbal subscore is 5. GCS motor subscore is 6.     Cranial Nerves: No cranial nerve deficit.     Sensory: No sensory deficit.  Psychiatric:        Speech: Speech normal.        Behavior: Behavior normal.      ED Treatments / Results  Labs (all labs ordered are listed, but only abnormal results are displayed) Labs Reviewed - No data to display  EKG None  Radiology No results found.  Procedures Procedures (including critical care time)  Medications Ordered in ED Medications - No data to display   Initial Impression / Assessment and Plan / ED Course  I have reviewed the triage vital signs and the nursing notes.  Pertinent labs & imaging results that were available during my care of the patient were reviewed by me and considered in my medical decision making (see chart for details).        Chest x-ray without acute findings.  Patient's EKG without acute findings either.  Pulse oximetry stable.  Fetal heart tones 146.  Low suspicion for PE or ACS.  Return precautions given   Final Clinical Impressions(s) / ED Diagnoses   Final diagnoses:  None    ED Discharge Orders    None       Lacretia Leigh, MD 04/03/19 1739

## 2019-04-15 ENCOUNTER — Other Ambulatory Visit: Payer: Self-pay

## 2019-04-15 ENCOUNTER — Inpatient Hospital Stay (HOSPITAL_COMMUNITY)
Admission: AD | Admit: 2019-04-15 | Discharge: 2019-04-15 | Disposition: A | Discharge status: Home / Self Care | Payer: Medicaid Other | Attending: Obstetrics and Gynecology | Admitting: Obstetrics and Gynecology

## 2019-04-15 ENCOUNTER — Encounter (HOSPITAL_COMMUNITY): Payer: Self-pay | Admitting: *Deleted

## 2019-04-15 DIAGNOSIS — Z91040 Latex allergy status: Secondary | ICD-10-CM | POA: Diagnosis not present

## 2019-04-15 DIAGNOSIS — N9089 Other specified noninflammatory disorders of vulva and perineum: Secondary | ICD-10-CM

## 2019-04-15 DIAGNOSIS — O26892 Other specified pregnancy related conditions, second trimester: Secondary | ICD-10-CM | POA: Diagnosis not present

## 2019-04-15 DIAGNOSIS — Z3A25 25 weeks gestation of pregnancy: Secondary | ICD-10-CM | POA: Diagnosis not present

## 2019-04-15 DIAGNOSIS — R102 Pelvic and perineal pain: Secondary | ICD-10-CM | POA: Diagnosis present

## 2019-04-15 LAB — WET PREP, GENITAL
Clue Cells Wet Prep HPF POC: NONE SEEN
Sperm: NONE SEEN
Trich, Wet Prep: NONE SEEN
Yeast Wet Prep HPF POC: NONE SEEN

## 2019-04-15 MED ORDER — LIDOCAINE HCL URETHRAL/MUCOSAL 2 % EX GEL: 1.0000 "application " | CUTANEOUS | 0 refills | Status: DC | PRN

## 2019-04-15 MED ORDER — LIDOCAINE HCL URETHRAL/MUCOSAL 2 % EX GEL
1.0000 "application " | Freq: Once | CUTANEOUS | Status: AC
  Administered 2019-04-15: 1 via TOPICAL
  Filled 2019-04-15: qty 5

## 2019-04-15 NOTE — Discharge Instructions (Signed)
[place] for [place] at [facility]       Phone: [phone]  [facility] [place] [facility] [place] Phone: [place] for [facility] at [place]  Phone: [place] for [place] at [facility]  Phone: [place] for [place] at [place]  Phone: [facility]       Phone: [phone]  [facility]    Phone: [phone]   [facility])    Phone: [facility]    Phone: [phone]  [facility]    Phone: [place] [facility]  Phone: [place]    Phone: [phone]  [facility]   Phone: [phone]  [facility]    Phone: [phone]   Safe Medications in Pregnancy   Acne: Benzoyl Peroxide Salicylic Acid  Backache/Headache: Tylenol: 2 regular strength every 4 hours OR              2 Extra strength every 6 hours  Colds/Coughs/Allergies: Benadryl (alcohol free) 25 mg every 6 hours as needed Breath right strips Claritin Cepacol throat lozenges Chloraseptic throat spray Cold-Eeze- up to three times per day Cough drops, alcohol free Flonase (by prescription only) Guaifenesin Mucinex Robitussin DM (plain only, alcohol free) Saline nasal spray/drops Sudafed (pseudoephedrine) & Actifed ** use only after [redacted] weeks gestation and if you do not have high blood pressure Tylenol Vicks Vaporub Zinc lozenges Zyrtec   Constipation: Colace Ducolax suppositories Fleet enema Glycerin suppositories Metamucil Milk of magnesia Miralax Senokot Smooth move tea  Diarrhea: Kaopectate Imodium A-D  *NO pepto Bismol  Hemorrhoids: Anusol Anusol HC Preparation  H Tucks  Indigestion: Tums Maalox Mylanta Zantac  Pepcid  Insomnia: Benadryl (alcohol free) 25mg  every 6 hours as needed Tylenol PM Unisom, no Gelcaps  Leg Cramps: Tums MagGel  Nausea/Vomiting:  Bonine Dramamine Emetrol Ginger extract Sea bands Meclizine  Nausea medication to take during pregnancy:  Unisom (doxylamine succinate 25 mg tablets) Take one tablet daily at bedtime. If symptoms are not adequately controlled, the dose can be increased to a maximum recommended dose of two tablets daily (1/2 tablet in the morning, 1/2 tablet mid-afternoon and one at bedtime). Vitamin B6 100mg  tablets. Take one tablet twice a day (up to 200 mg per day).  Skin Rashes: Aveeno products Benadryl cream or 25mg  every 6 hours as needed Calamine Lotion 1% cortisone cream  Yeast infection: Gyne-lotrimin 7 Monistat 7  Gum/tooth pain: Anbesol  **If taking multiple medications, please check labels to avoid duplicating the same active ingredients **take medication as directed on the label ** Do not exceed 4000 mg of tylenol in 24 hours **Do not take medications that contain aspirin or ibuprofen

## 2019-04-15 NOTE — MAU Provider Note (Signed)
Chief Complaint:  Vaginal Pain   First Provider Initiated Contact with Patient 04/15/19 1633     HPI: Nina [name] is a 18 y.o. G1P0 at [redacted]w[redacted]d who presents to maternity admissions reporting vaginal lesion/pain and vaginal discharge. Symptoms started 1 week ago. Feels like she has a split on her right labia. Pain worse when urine touches the area. Also has had increase in discharge that she says she was told was normal during pregnancy. States this happened a few years ago. Was given lidocaine which helped with her symptoms.  No intercourse since [redacted] wks gestation. Was treated for chlamydia last month & had a negative GC/CT result last week (per care everywhere). Has not shaved her vulva since the beginning of her pregnancy. Denies contractions, leakage of fluid or vaginal bleeding. Good fetal movement.  Pregnancy Course: gets care in [facility]. Denies complications with pregnancy.   Past Medical History:  Diagnosis Date  . Allergy   . Depression   . Headache(784.0)   . Panic attack    OB History  Gravida Para Term Preterm AB Living  1            SAB TAB Ectopic Multiple Live Births               # Outcome Date GA Lbr Len/2nd Weight Sex Delivery Anes PTL Lv  1 Current            Past Surgical History:  Procedure Laterality Date  . NO PAST SURGERIES     Family History  Problem Relation Age of Onset  . Migraines Mother   . Migraines Maternal Grandmother    Social History   Tobacco Use  . Smoking status: Passive Smoke Exposure - Never Smoker  . Smokeless tobacco: Never Used  Substance Use Topics  . Alcohol use: No  . Drug use: Not Currently    Types: Marijuana    Comment: approx 3 wks ago    Allergies  Allergen Reactions  . Latex Rash   Medications Prior to Admission  Medication Sig Dispense Refill Last Dose  . prenatal vitamin w/FE, FA (PRENATAL 1 + 1) 27-1 MG TABS tablet Take 1 tablet by mouth daily at 12 noon.   04/14/2019 at 1630  . hydrOXYzine (VISTARIL) 25 MG  capsule Take 25-50 mg by mouth at bedtime.       I have reviewed patient's Past Medical Hx, Surgical Hx, Family Hx, Social Hx, medications and allergies.   ROS:  Review of Systems  Constitutional: Negative.   Gastrointestinal: Negative.   Genitourinary: Positive for genital sores, vaginal discharge and vaginal pain. Negative for dysuria and vaginal bleeding.    Physical Exam   Patient Vitals for the past 24 hrs:  BP Temp Temp src Pulse Resp SpO2 Weight  04/15/19 1750 120/63 (!) 97.4 F (36.3 C) Oral 74 - 100 % -  04/15/19 1541 112/73 98.4 F (36.9 C) Oral 76 16 100 % 56.8 kg    Constitutional: Well-developed, well-nourished female in no acute distress.  Cardiovascular: normal rate & rhythm, no murmur Respiratory: normal effort, lung sounds clear throughout GI: Abd soft, non-tender, gravid appropriate for gestational age. Pos BS x 4 MS: Extremities nontender, no edema, normal ROM Neurologic: Alert and oriented x 4.  GU:      Pelvic: ~59mm fissure on right lower labia majora. No other lesions. Spec exam: no blood. Small amount of physiologic discharge. Cervix visually closed/not friable.      NST:  Baseline: 140  bpm, Variability: Good {> 6 bpm), Accelerations: Non-reactive but appropriate for gestational age and Decelerations: Absent   Labs: Results for orders placed or performed during the hospital encounter of 04/15/19 (from the past 24 hour(s))  Wet prep, genital     Status: Abnormal   Collection Time: 04/15/19  5:00 PM   Specimen: Cervix  Result Value Ref Range   Yeast Wet Prep HPF POC NONE SEEN NONE SEEN   Trich, Wet Prep NONE SEEN NONE SEEN   Clue Cells Wet Prep HPF POC NONE SEEN NONE SEEN   WBC, Wet Prep HPF POC MANY (A) NONE SEEN   Sperm NONE SEEN     Imaging:  No results found.  MAU Course: Orders Placed This Encounter  Procedures  . Wet prep, genital  . Herpes simplex virus (HSV), DNA by PCR Sterile Swab  . Discharge patient   Meds ordered this encounter   Medications  . lidocaine (XYLOCAINE) 2 % jelly 1 application  . lidocaine (XYLOCAINE) 2 % jelly    Sig: Apply 1 application topically as needed.    Dispense:  30 mL    Refill:  0    Order Specific Question:   Supervising Provider    Answer:   CONSTANT, PEGGY [[id number]]    MDM: Category 1 tracing No ob complaints  Pt presented to MAU in 2018 with similar complaint. Was tx for presumed HSV infection, however her swab was negative. States this has happened twice since then. On exam, tiny fissure. No other lesions. HSV swab collected today, although lesion is not consistent with HSV. Will apply lidocaine as this has helped patient in the past.   Wet prep collected. GC/CT negative last week  Pt receives care in [facility] but lives in [place] and thinks she will deliver here. Will give list of local prenatal providers.   Assessment: 1. Vulvar lesion   2. [redacted] weeks gestation of pregnancy     Plan: Discharge home in stable condition.  Discussed reasons to return to MAU HSV culture pending Rx lidocaine gel  Follow-up Information    [facility] Follow up.   Specialty: Obstetrics and Gynecology Why: return for worsening symptoms Contact information: [address] [id number] [place] [phone]          Allergies as of 04/15/2019      Reactions   Latex Rash      Medication List    STOP taking these medications   hydrOXYzine 25 MG capsule Commonly known as: VISTARIL     TAKE these medications   lidocaine 2 % jelly Commonly known as: XYLOCAINE Apply 1 application topically as needed.   prenatal vitamin w/FE, FA 27-1 MG Tabs tablet Take 1 tablet by mouth daily at 12 noon.       Jorje Guild, NP 04/15/2019 6:03 PM

## 2019-04-15 NOTE — MAU Note (Signed)
Her vagina is splitting open.  Has been there for like a wk. When she showers, uses the bathroom, anything- it hurts.  It is like a paper cut or something that scabs over, but then the scab gets wiped off. Was seen at [facility] in like 2017 for the same thing.

## 2019-04-17 LAB — HSV DNA BY PCR (REFERENCE LAB)
HSV 1 DNA: NEGATIVE
HSV 2 DNA: NEGATIVE

## 2019-05-13 IMAGING — CR DG LUMBAR SPINE COMPLETE 4+V
5 series · 5 of 5 positions shown · non-contrast
Comparison: 07/29/2015

CLINICAL DATA: Pain across lower back starting this morning when
patient awoke

EXAM:
LUMBAR SPINE - COMPLETE 4+ VIEW

[t lumbar spine ap]
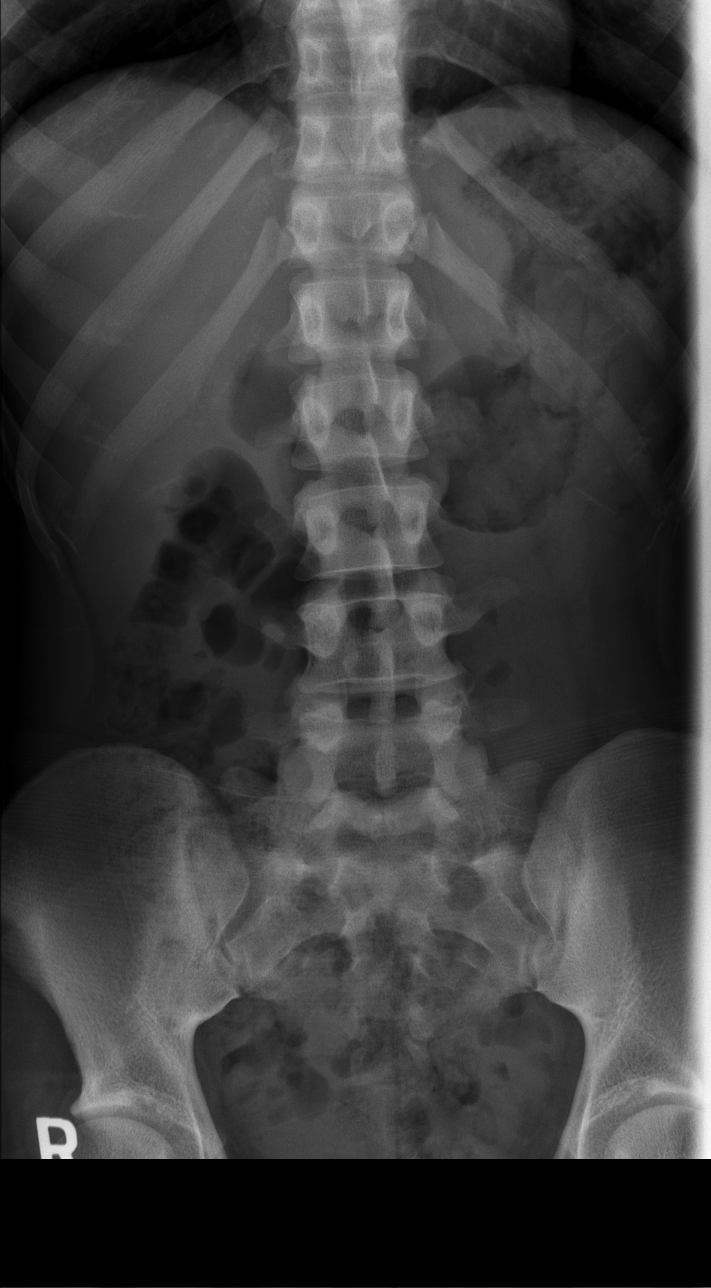

[t lumbar spine obl (1 of 2)]
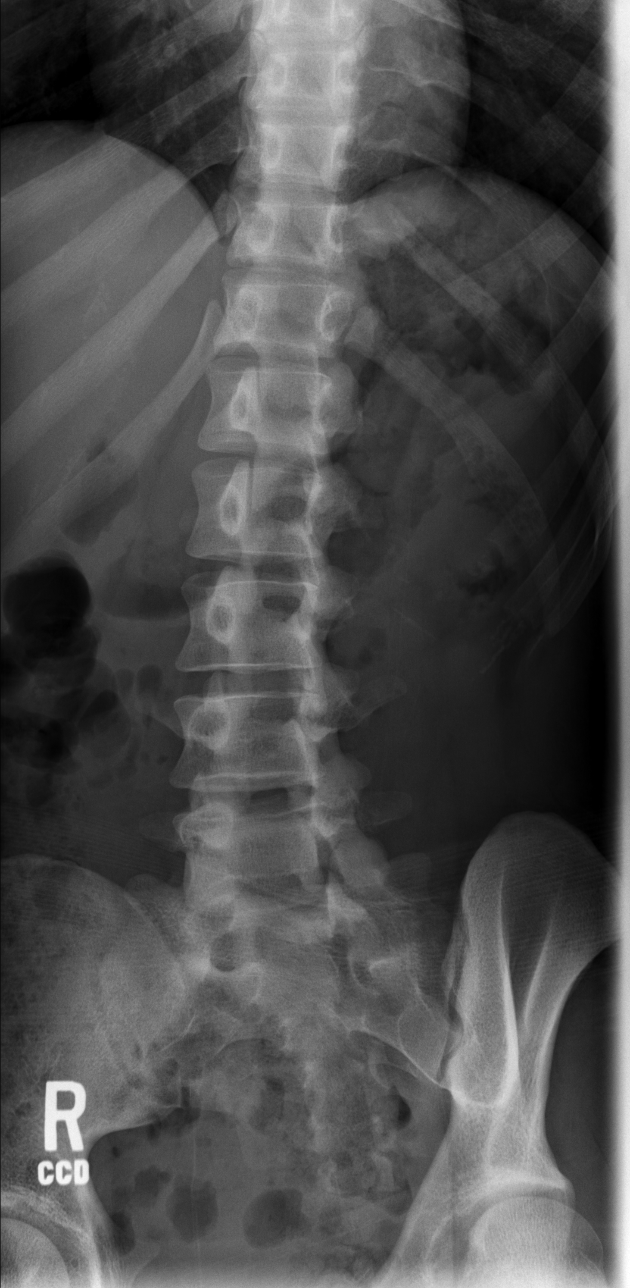

[t lumbar spine obl (2 of 2)]
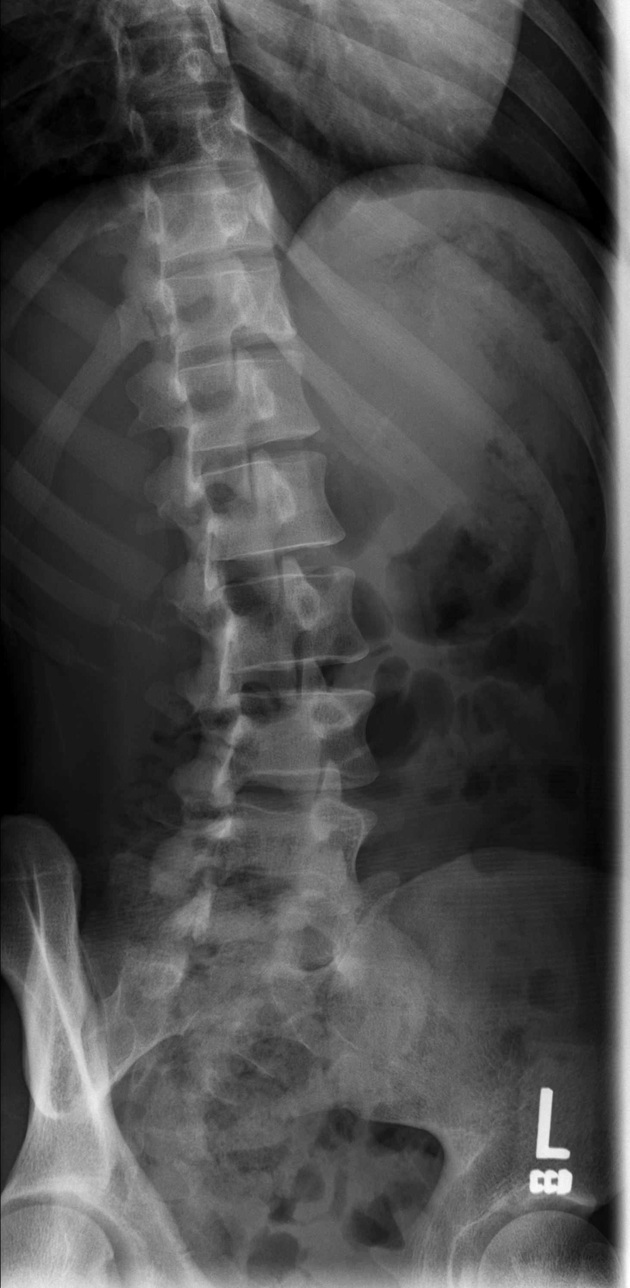

[t lumbar spine lat]
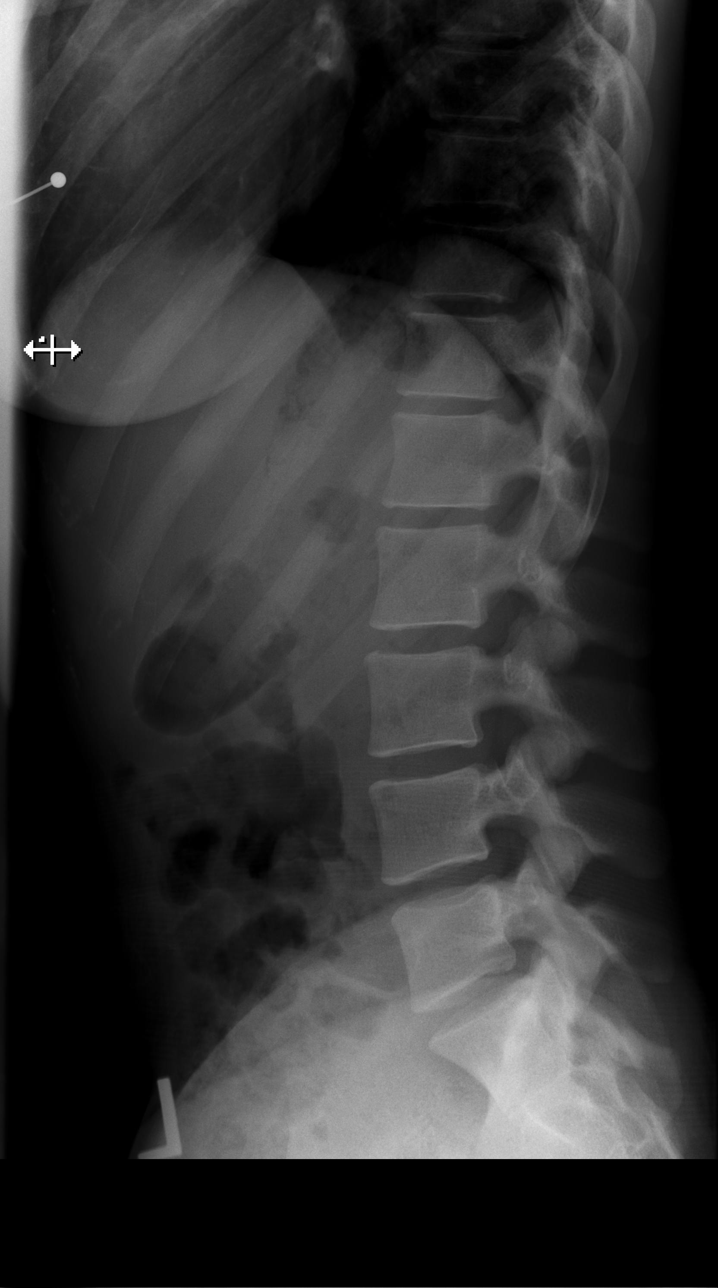

[t lumbar l-5 s-1 spot]
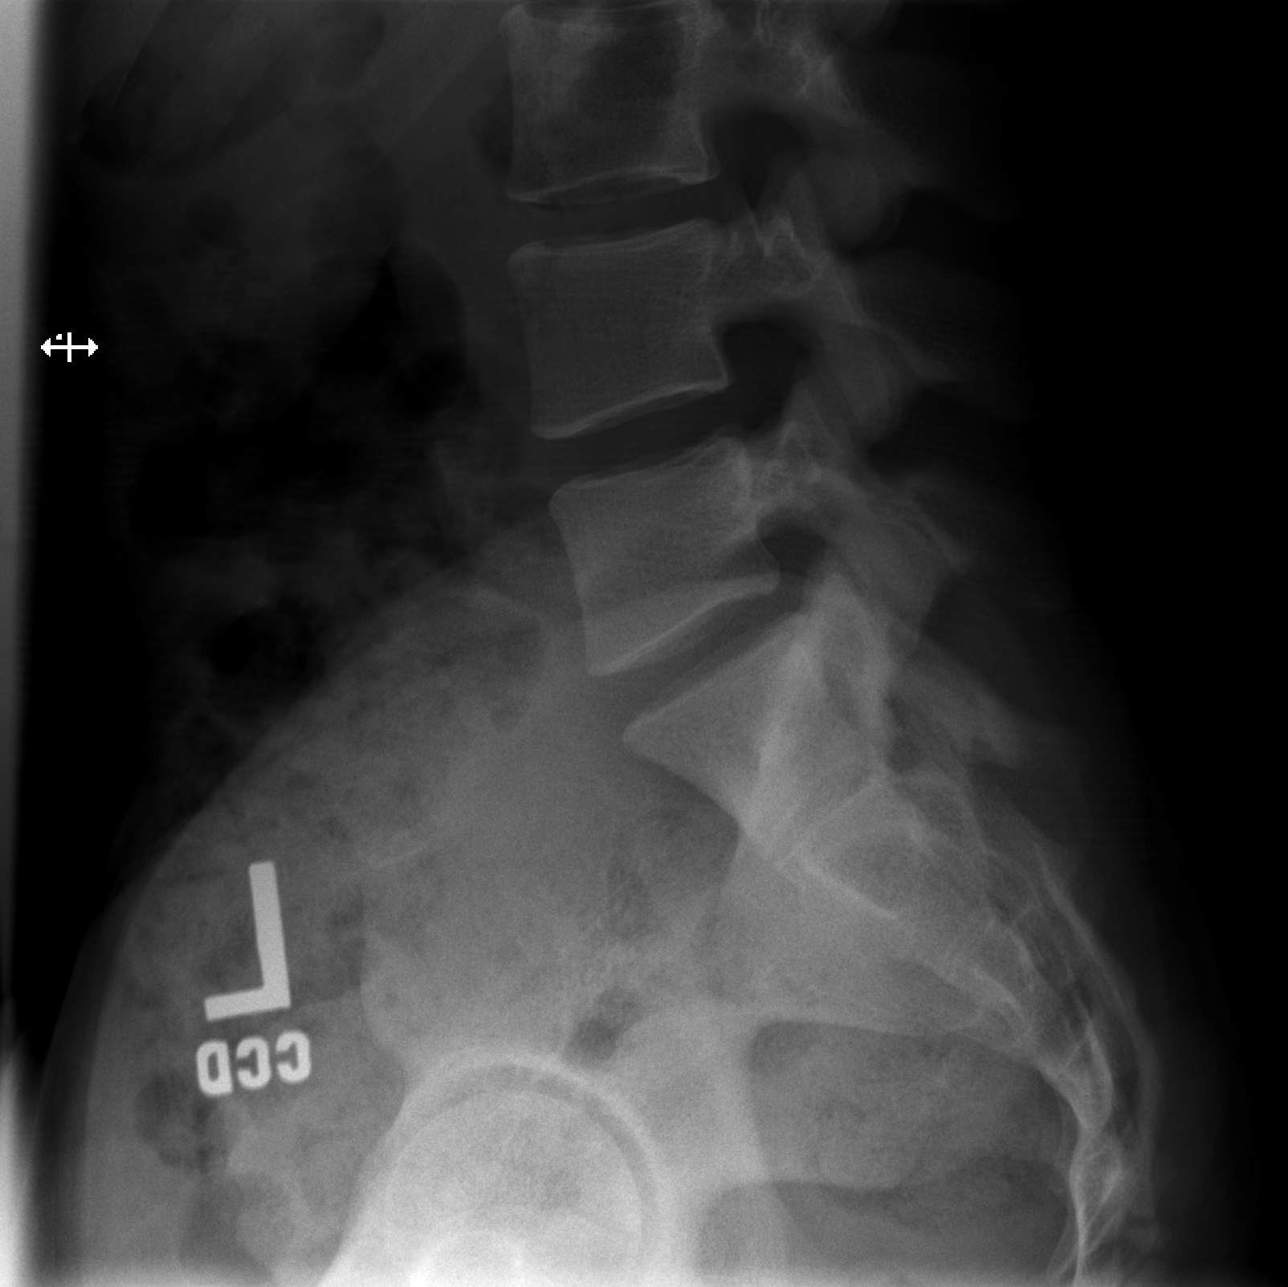

[5 of 5 positions shown; findings below may reference images not displayed]

FINDINGS: Five non-rib-bearing lumbar vertebra.

Osseous mineralization normal.

Vertebral body and disc space heights maintained.

No acute fracture, subluxation or bone destruction.

SI joints preserved.

No spondylolysis.
IMPRESSION: No acute abnormalities.

## 2019-05-13 IMAGING — CT CT RENAL STONE PROTOCOL
2 of 4 series · 16 of 46 positions shown, 18 images · non-contrast
Comparison: None

CLINICAL DATA: Awoke at 6866 hours with severe flank pain side not
specified, burning with urination

EXAM:
CT ABDOMEN AND PELVIS WITHOUT CONTRAST
TECHNIQUE: Multidetector CT imaging of the abdomen and pelvis was performed
following the standard protocol without IV contrast. Sagittal and
coronal MPR images reconstructed from axial data set. Oral contrast
was not administered.

[Series 2: axial st · axial · 0.60mm/px · z∈[+1032,+1387]mm · 13 of 81 slices shown, 15 images]
[im 5/81  soft-tissue]
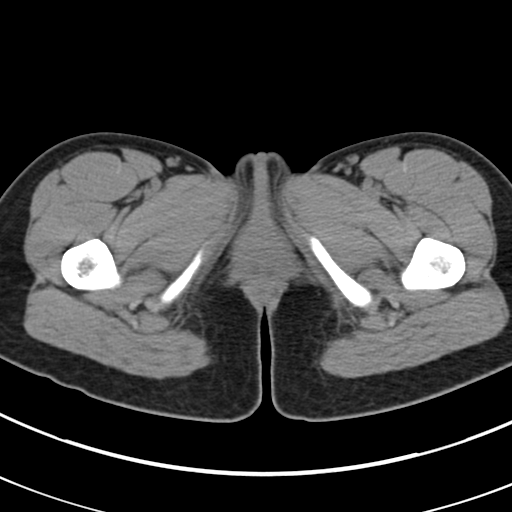
[im 5/81  bone]
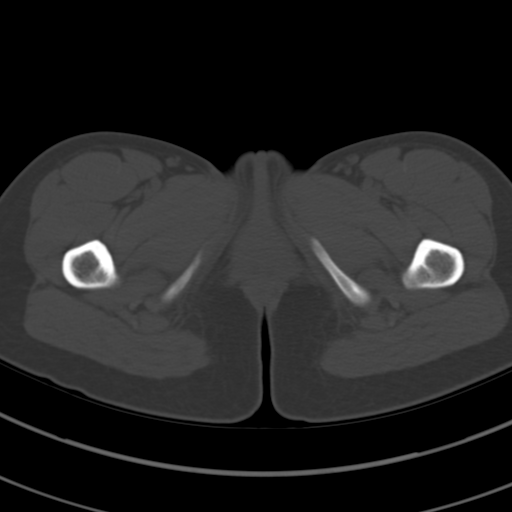
[im 13/81  soft-tissue]
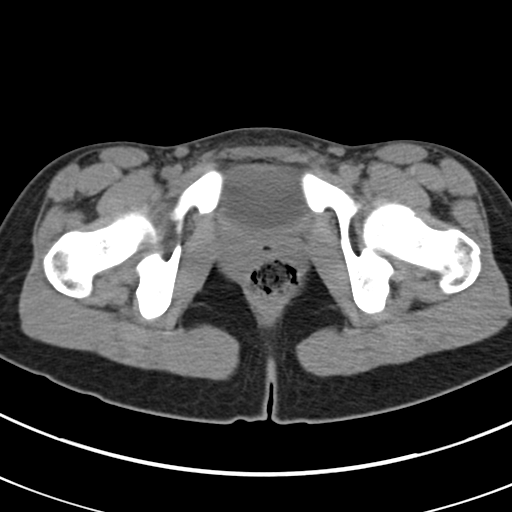
[im 17/81  soft-tissue]
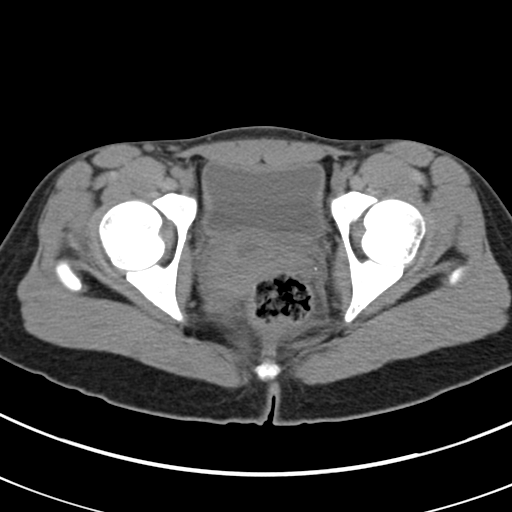
[im 22/81  soft-tissue]
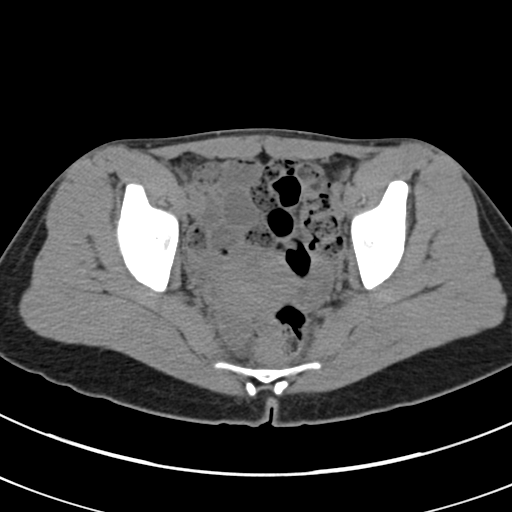
[im 30/81  soft-tissue]
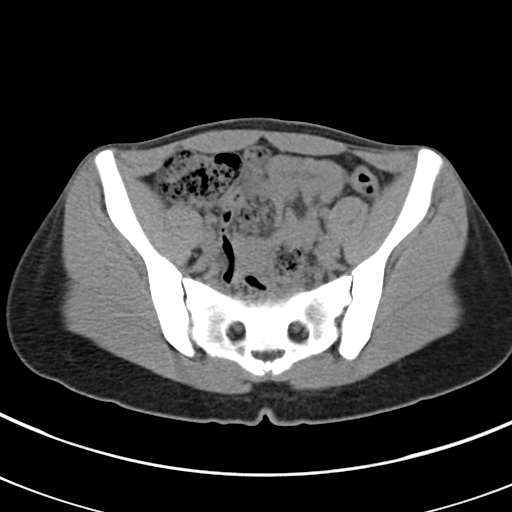
[im 34/81  soft-tissue]
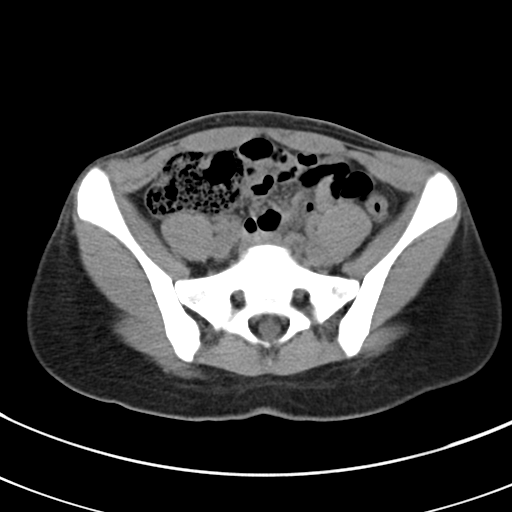
[im 43/81  soft-tissue]
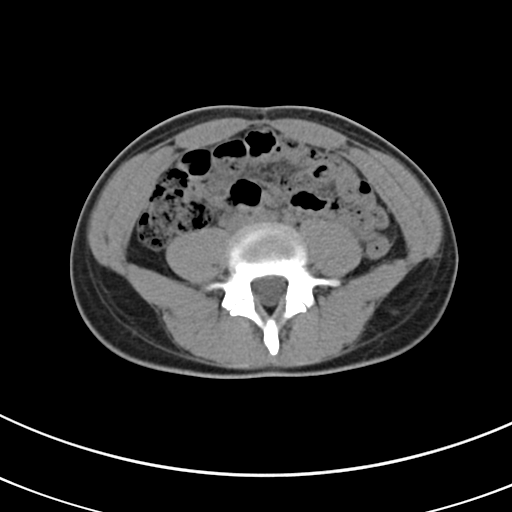
[im 47/81  soft-tissue]
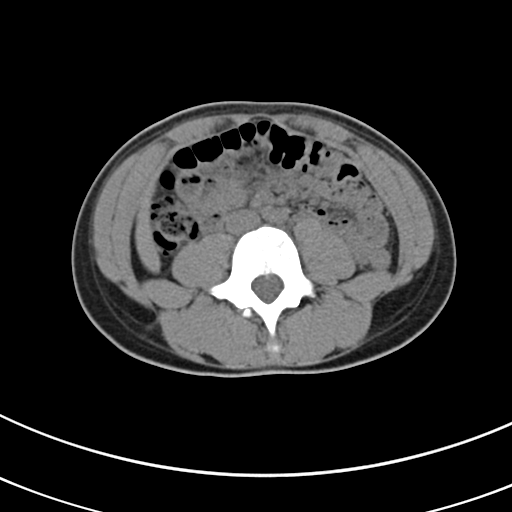
[im 51/81  soft-tissue]
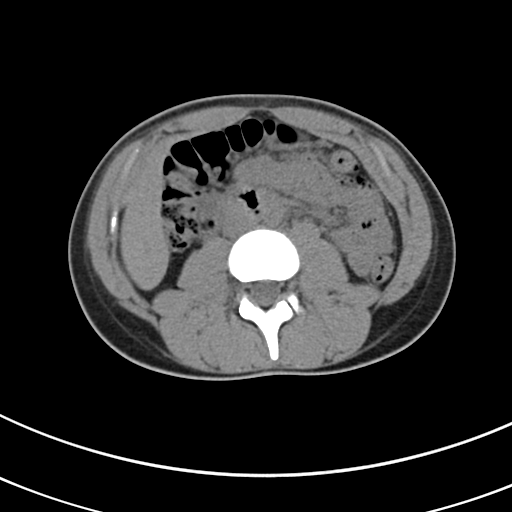
[im 51/81  bone]
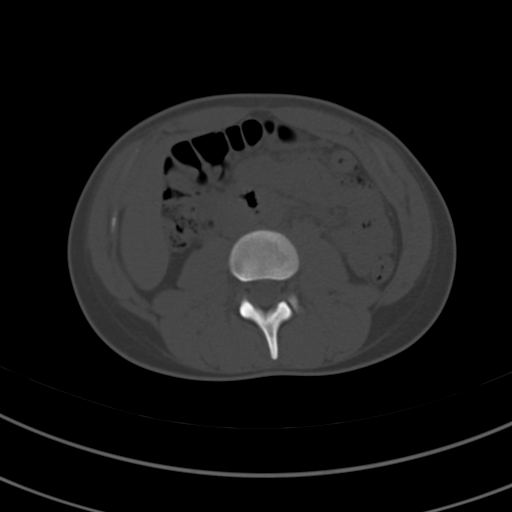
[im 59/81  soft-tissue]
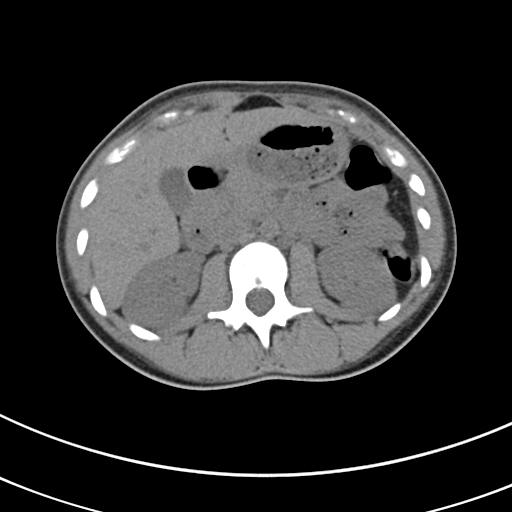
[im 64/81  soft-tissue]
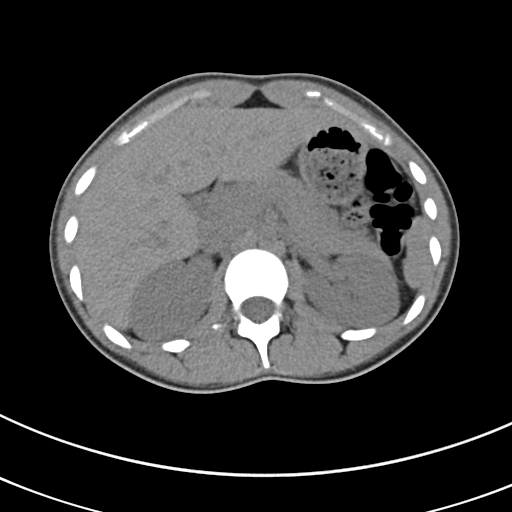
[im 68/81  soft-tissue]
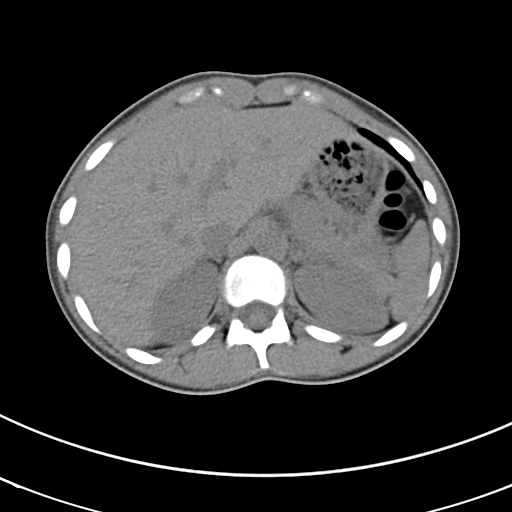
[im 76/81  soft-tissue]
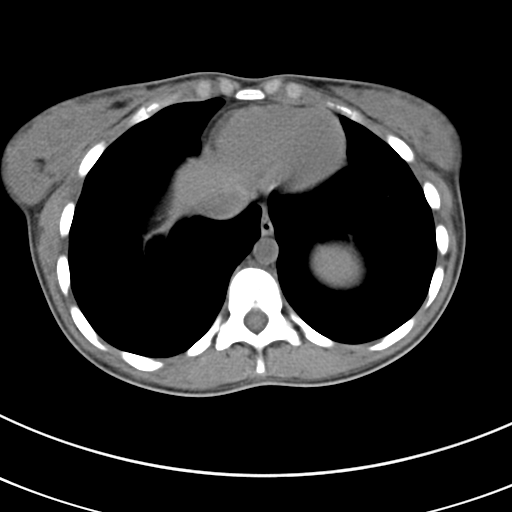

[Series 4: coronal · coronal · 0.61mm/px · 3 of 135 slices shown]
[im 45/135  soft-tissue]
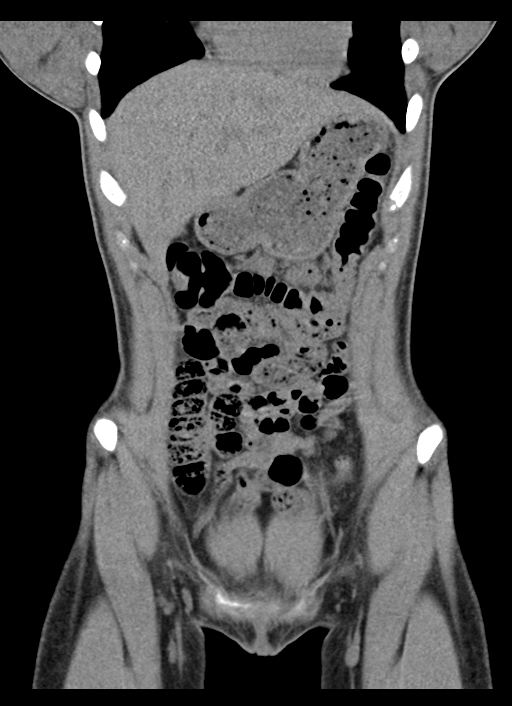
[im 60/135  soft-tissue]
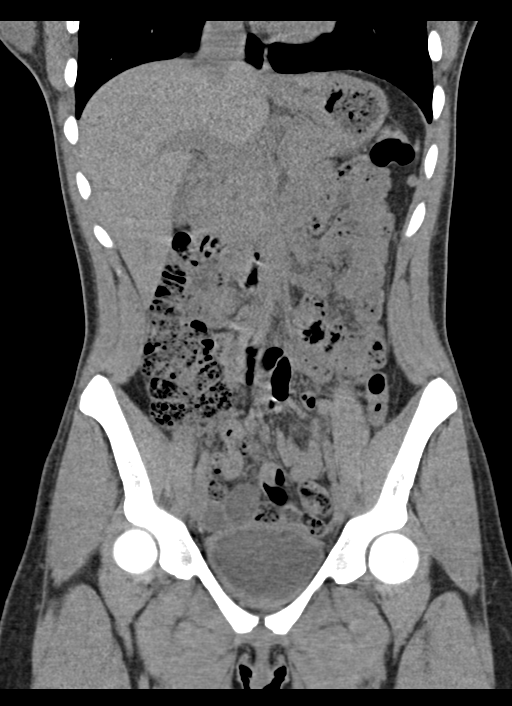
[im 75/135  soft-tissue]
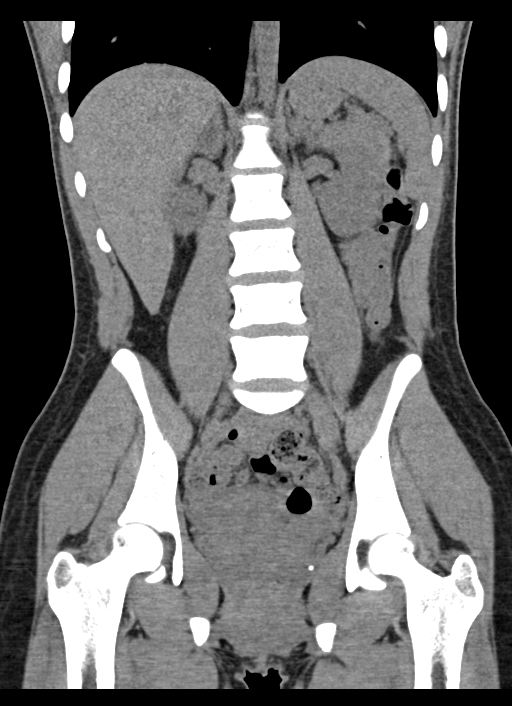

[16 of 46 positions shown; findings below may reference images not displayed]

FINDINGS: Lower chest: Lung bases clear

Hepatobiliary: Gallbladder and liver unremarkable

Pancreas: Normal appearance

Spleen: Normal appearance

Adrenals/Urinary Tract: Adrenal glands normal appearance. Kidneys
normal appearance. No hydronephrosis or hydroureter. No definite
ureteral calcifications. Bladder only partially distended, grossly
unremarkable.

Stomach/Bowel: Normal appendix. Stomach and bowel loops normal
appearance for technique

Vascular/Lymphatic: Few pelvic phleboliths. Aorta normal caliber. No
adenopathy

Reproductive: Unremarkable uterus and ovaries

Other: No free air or free fluid. No hernia or definite acute
inflammatory process.

Musculoskeletal: Unremarkable
IMPRESSION: No acute intra-abdominal or intrapelvic abnormalities.

## 2019-06-05 ENCOUNTER — Inpatient Hospital Stay (HOSPITAL_COMMUNITY)
Admission: AD | Admit: 2019-06-05 | Discharge: 2019-06-05 | Disposition: A | Discharge status: Home / Self Care | Payer: Medicaid Other | Attending: Obstetrics & Gynecology | Admitting: Obstetrics & Gynecology

## 2019-06-05 ENCOUNTER — Other Ambulatory Visit: Payer: Self-pay

## 2019-06-05 DIAGNOSIS — O26893 Other specified pregnancy related conditions, third trimester: Secondary | ICD-10-CM | POA: Insufficient documentation

## 2019-06-05 DIAGNOSIS — H9203 Otalgia, bilateral: Secondary | ICD-10-CM | POA: Diagnosis not present

## 2019-06-05 DIAGNOSIS — R0989 Other specified symptoms and signs involving the circulatory and respiratory systems: Secondary | ICD-10-CM | POA: Insufficient documentation

## 2019-06-05 DIAGNOSIS — Z3A32 32 weeks gestation of pregnancy: Secondary | ICD-10-CM | POA: Insufficient documentation

## 2019-06-05 NOTE — MAU Note (Signed)
.   Nina [name] is a 18 y.o. at [redacted]w[redacted]d here in MAU reporting: that she has a runny nose and her ears hurt. States she was told to come in and be evaluated LMP:  Onset of complaint: 2 days Pain score: 0 Vitals:   06/05/19 1503  BP: 122/78  Pulse: 95  Resp: 16  Temp: 98 F (36.7 C)  SpO2: 100%     FHT:134 Lab orders placed from triage:

## 2019-06-05 NOTE — Progress Notes (Signed)
Patient Nina [name] is a 18 y.o. G1P0 At [redacted]w[redacted]d here with complaints of pressure in her ears and nose. She says her ob "would not see her" today because she had COVID symptoms. Patient says she was told to come here for evaluation and a COVID test. She gets her care at [facility] in [facility]; her pregnancy has been complicated by IUGR (per patient).   Patient is well-appearing and her vitals are stable.  she states that she always gets a stuffy nose when the weather changes and that she is using nasal spray to help. Her nasal discharge is clear; she denies fever, cough, chills, SOB.  I recommended that she follow up with her PCP or urgent care, I do not feel that she warrants antibiotics today (Day # 2) of symptoms. I suggested she consider a COVID test because of her stuffy nose, but that her symptoms otherwise are not concerning. Patient does not feel that she has COVID but will consider COVID test at a community testing site.   Patient agrees with plan to follow up with urgent care or PCP; all questions Answered.  Maye Hides

## 2019-07-17 ENCOUNTER — Inpatient Hospital Stay (HOSPITAL_COMMUNITY)
Admission: AD | Admit: 2019-07-17 | Discharge: 2019-07-18 | Disposition: A | Discharge status: Home / Self Care | Payer: Medicaid Other | Attending: Obstetrics and Gynecology | Admitting: Obstetrics and Gynecology

## 2019-07-17 ENCOUNTER — Other Ambulatory Visit: Payer: Self-pay

## 2019-07-17 ENCOUNTER — Encounter (HOSPITAL_COMMUNITY): Payer: Self-pay | Admitting: Obstetrics and Gynecology

## 2019-07-17 DIAGNOSIS — N898 Other specified noninflammatory disorders of vagina: Secondary | ICD-10-CM

## 2019-07-17 DIAGNOSIS — Z7722 Contact with and (suspected) exposure to environmental tobacco smoke (acute) (chronic): Secondary | ICD-10-CM | POA: Insufficient documentation

## 2019-07-17 DIAGNOSIS — O26893 Other specified pregnancy related conditions, third trimester: Secondary | ICD-10-CM | POA: Insufficient documentation

## 2019-07-17 DIAGNOSIS — O471 False labor at or after 37 completed weeks of gestation: Secondary | ICD-10-CM | POA: Insufficient documentation

## 2019-07-17 DIAGNOSIS — Z3A38 38 weeks gestation of pregnancy: Secondary | ICD-10-CM | POA: Insufficient documentation

## 2019-07-17 DIAGNOSIS — O479 False labor, unspecified: Secondary | ICD-10-CM

## 2019-07-17 NOTE — MAU Note (Signed)
Pt states she started having consistence ctx's at 2030.   Pt reports that she has normal discharge, but today has had warm discharge.

## 2019-07-18 DIAGNOSIS — N898 Other specified noninflammatory disorders of vagina: Secondary | ICD-10-CM | POA: Diagnosis not present

## 2019-07-18 DIAGNOSIS — Z3A38 38 weeks gestation of pregnancy: Secondary | ICD-10-CM | POA: Diagnosis not present

## 2019-07-18 DIAGNOSIS — Z7722 Contact with and (suspected) exposure to environmental tobacco smoke (acute) (chronic): Secondary | ICD-10-CM | POA: Diagnosis not present

## 2019-07-18 DIAGNOSIS — O471 False labor at or after 37 completed weeks of gestation: Secondary | ICD-10-CM | POA: Diagnosis present

## 2019-07-18 DIAGNOSIS — O26893 Other specified pregnancy related conditions, third trimester: Secondary | ICD-10-CM | POA: Diagnosis not present

## 2019-07-18 NOTE — Discharge Instructions (Signed)

## 2019-07-18 NOTE — MAU Provider Note (Signed)
Chief Complaint:  Contractions  Provider saw patient at  0005hrs   HPI: Nina [name] is a 19 y.o. G1P0 at 69w3dwho presents to maternity admissions reporting contractions and "warm discharge".  I was asked to see her for possible leaking fluid.  When I asked about that she stated she was not leaking fluid, it was not watery, it was "warm.  And it's not usually warm"..  Declines evaluation for ruptured membranes.  Does agree to cervical exam.   Is having contractions but they are not painful.  States her doctor in [place] told her to come here. . She reports good fetal movement, denies LOF, vaginal bleeding, vaginal itching/burning, urinary symptoms, h/a, dizziness, n/v, diarrhea, constipation or fever/chills.     Review of some CareEverywhere notes reveal initial care in [place] with plans to deliver at [facility]. Then went to [facility] in January because she wanted to deliver there.  Tells [place] she may deliver here.    Vaginal Discharge The patient's primary symptoms include vaginal discharge. The patient's pertinent negatives include no genital itching, genital lesions, genital odor, pelvic pain or vaginal bleeding. Primary symptoms comment: after exam describes a chronic "cut" on perineum, denies hx HSV. This is a new problem. The current episode started today. The problem occurs constantly. The problem has been unchanged. The patient is experiencing no pain. Pertinent negatives include no abdominal pain, chills, constipation, diarrhea, dysuria, fever, nausea or vomiting. The vaginal discharge was clear and mucoid.    Copied from [facility] per CareEverywhere  High-risk pregnancy - PNL and imaging reviewed - Last growth 06/16/2019: 1964 g EFW 32% - Urine Gc/C ordered today > patient unable to provide urine sample and declines vaginal swab. She states she will provide urine sample at ultrasound visit next week. Order placed in Epic today.  - IOL has been scheduled at 39 weeks - patient states she  desires midwifery care and unmedicated delivery. Patient advised that pending staff availability, we will try to accommodate her request for midwifery care. COVID 19 screening and visitor policy discussed with patient at length. Patient advised she may only have 1 visitor (not 2 as she believed) during her hospital stay.  - Contraceptive counseling done today. Alternatives/risks and benefits discussed at length. Patient is considering patch at 6 weeks postpartum.  - Follow up growth ultrasound has been scheduled at [facility] next week - Patient advised to schedule PNV next week, however she declines this stating it is difficult for her to come to [facility] and sees MFM at [facility] for ultrasound next week anyhow.  - Labor precautions, pre-eclampsia precautions advised - Fetal kick count instructions reviewed   Past Medical History: Past Medical History:  Diagnosis Date  . Allergy   . Depression   . Headache(784.0)   . Panic attack     Past obstetric history: OB History  Gravida Para Term Preterm AB Living  1            SAB TAB Ectopic Multiple Live Births               # Outcome Date GA Lbr Len/2nd Weight Sex Delivery Anes PTL Lv  1 Current             Past Surgical History: Past Surgical History:  Procedure Laterality Date  . NO PAST SURGERIES      Family History: Family History  Problem Relation Age of Onset  . Migraines Mother   . Migraines Maternal Grandmother  Social History: Social History   Tobacco Use  . Smoking status: Passive Smoke Exposure - Never Smoker  . Smokeless tobacco: Never Used  Substance Use Topics  . Alcohol use: No  . Drug use: Not Currently    Types: Marijuana    Comment: approx 3 wks ago     Allergies:  Allergies  Allergen Reactions  . Grass Pollen(K-O-R-T-Swt Vern) Hives  . Latex Rash    Meds:  Medications Prior to Admission  Medication Sig Dispense Refill Last Dose  . butalbital-acetaminophen-caffeine (FIORICET) 50-325-40 MG tablet  Take 1 tablet by mouth 2 (two) times daily as needed for headache.   Past Week at Unknown time  . famotidine (PEPCID) 20 MG tablet Take 20 mg by mouth 2 (two) times daily.   07/17/2019 at Unknown time  . prenatal vitamin w/FE, FA (PRENATAL 1 + 1) 27-1 MG TABS tablet Take 1 tablet by mouth daily at 12 noon.   07/17/2019 at Unknown time  . hydrOXYzine (ATARAX/VISTARIL) 25 MG tablet Take 25 mg by mouth 3 (three) times daily as needed.   More than a month at Unknown time  . lidocaine (XYLOCAINE) 2 % jelly Apply 1 application topically as needed. 30 mL 0   . SUMAtriptan (IMITREX) 100 MG tablet Take 100 mg by mouth every 2 (two) hours as needed for migraine. May repeat in 2 hours if headache persists or recurs.   More than a month at Unknown time    I have reviewed patient's Past Medical Hx, Surgical Hx, Family Hx, Social Hx, medications and allergies.   ROS:  Review of Systems  Constitutional: Negative for chills and fever.  Respiratory: Negative for shortness of breath.   Gastrointestinal: Negative for abdominal pain, constipation, diarrhea, nausea and vomiting.  Genitourinary: Positive for vaginal discharge. Negative for dysuria and pelvic pain.  Neurological: Negative for dizziness.   Other systems negative  Physical Exam   Patient Vitals for the past 24 hrs:  BP Temp Temp src Pulse Resp  07/17/19 2339 116/69 98.3 F (36.8 C) Oral (!) 107 12   Constitutional: Well-developed, well-nourished female in no acute distress.  Cardiovascular: normal rate and rhythm Respiratory: normal effort, clear to auscultation bilaterally GI: Abd soft, non-tender, gravid appropriate for gestational age.   No rebound or guarding. MS: Extremities nontender, no edema, normal ROM Neurologic: Alert and oriented x 4.  GU: Neg CVAT.  PELVIC EXAM: Declines speculum exam Dilation: 1 Effacement (%): 80 Station: -1 Presentation: Vertex Exam by:: Wynelle Bourgeois, CNM  FHT:  Baseline 140 , moderate variability,  accelerations present, no decelerations Contractions: q 3-5 mins Irregular    Labs: No results found for this or any previous visit (from the past 24 hour(s)).  RN attempted fern slide w/o success  Imaging:  No results found.  MAU Course/MDM: NST reviewed, reactive with irregular contractions.  Patient states they are not painful.  Discussed stages of labor.  Offered observation for an hour with recheck of cervix Patient states will go home and come back if contractions become more painful Treatments in MAU included EFM.    Assessment: Single intrauterine pregnancy at [redacted]w[redacted]d Vaginal discharge, physiologic Uterine contractions  Plan: Discharge home Labor precautions and fetal kick counts Follow up in Office for prenatal visits   Encouraged to return here or to other Urgent Care/ED if she develops worsening of symptoms, increase in pain, fever, or other concerning symptoms.   Pt stable at time of discharge.  Wynelle Bourgeois CNM, MSN Certified Nurse-Midwife 07/18/2019  12:26 AM

## 2020-03-21 ENCOUNTER — Other Ambulatory Visit: Payer: Self-pay

## 2020-03-21 ENCOUNTER — Emergency Department (HOSPITAL_COMMUNITY)
Admission: EM | Admit: 2020-03-21 | Discharge: 2020-03-21 | Discharge status: Against Medical Advice | Payer: Medicaid Other

## 2020-12-25 ENCOUNTER — Other Ambulatory Visit: Payer: Self-pay

## 2020-12-25 ENCOUNTER — Inpatient Hospital Stay (HOSPITAL_COMMUNITY)
Admission: AD | Admit: 2020-12-25 | Discharge: 2020-12-25 | Disposition: A | Discharge status: Home / Self Care | Payer: Medicaid Other | Attending: Obstetrics & Gynecology | Admitting: Obstetrics & Gynecology

## 2020-12-25 ENCOUNTER — Encounter (HOSPITAL_COMMUNITY): Payer: Self-pay | Admitting: Obstetrics & Gynecology

## 2020-12-25 DIAGNOSIS — O26891 Other specified pregnancy related conditions, first trimester: Secondary | ICD-10-CM | POA: Insufficient documentation

## 2020-12-25 DIAGNOSIS — R140 Abdominal distension (gaseous): Secondary | ICD-10-CM | POA: Insufficient documentation

## 2020-12-25 DIAGNOSIS — Z3A13 13 weeks gestation of pregnancy: Secondary | ICD-10-CM | POA: Diagnosis not present

## 2020-12-25 DIAGNOSIS — F1290 Cannabis use, unspecified, uncomplicated: Secondary | ICD-10-CM | POA: Diagnosis not present

## 2020-12-25 DIAGNOSIS — K9049 Malabsorption due to intolerance, not elsewhere classified: Secondary | ICD-10-CM

## 2020-12-25 DIAGNOSIS — K30 Functional dyspepsia: Secondary | ICD-10-CM | POA: Diagnosis not present

## 2020-12-25 DIAGNOSIS — O99321 Drug use complicating pregnancy, first trimester: Secondary | ICD-10-CM | POA: Diagnosis not present

## 2020-12-25 DIAGNOSIS — Z7722 Contact with and (suspected) exposure to environmental tobacco smoke (acute) (chronic): Secondary | ICD-10-CM | POA: Insufficient documentation

## 2020-12-25 DIAGNOSIS — E739 Lactose intolerance, unspecified: Secondary | ICD-10-CM | POA: Diagnosis not present

## 2020-12-25 DIAGNOSIS — O99611 Diseases of the digestive system complicating pregnancy, first trimester: Secondary | ICD-10-CM | POA: Diagnosis not present

## 2020-12-25 NOTE — MAU Note (Signed)
Pt reports vaginal cramping that started this afternoon. Reports vaginal spotting following BM. Tried to lye down and rest without resolve. Denies bleeding, bloody show or SROM.

## 2020-12-25 NOTE — Discharge Instructions (Signed)
-  avoid dairy, greasy foods, avoid spicy/acidic -add in vegetables, fruit

## 2020-12-25 NOTE — MAU Provider Note (Addendum)
Patient Nina [name] is a 20 y.o.  G2P1001 at [redacted]w[redacted]d here with questions about abdominal pain that started today at 2 pm.  The pain goes right across her belly at the level of the belly button. She does not drink water, does not want to be tested for STI or UTI or any other testing. She does not want to change into a gown, she reports that she just wants to hear a heat beat because she wants to make sure "its not a stillbirth". She is concerned that the baby's cord is too long and that it could be affecting the baby.    She denies constipation, diarrhea, endorses some nausea and vomiting. She denies abnormal discharge, dysuria, vaginal pain. She denies vaginal bleeding, other spotting after she had a BM today.   She states that she is High Risk and will be delivering at [facility].   She had sexual intercourse this morning; she wonders if she if the sexual intercourse could cause  History     CSN: [id number]  Arrival date and time: 12/25/20 2047   Event Date/Time   First Provider Initiated Contact with Patient 12/25/20 2156      Chief Complaint  Patient presents with   uterine cramping   Abdominal Pain This is a new problem. The current episode started today. The problem occurs constantly. Pain location: straight across belly and right above her pubic syhmphsis. The pain is at a severity of 6/10. The pain does not radiate. Pertinent negatives include no constipation, diarrhea, dysuria or nausea. Relieved by: curled up.  She reports feeling bloated today, have more flatus and belching.  Diet: Thursday she at tacos, she has been eating sour cream and dairy which she doesn't eat normally, two cold cut sandwiches Friday she at burgers and chili dip, beans, peppers Today she ate fries and a brownie, almond milk, chicken patties  OB History     Gravida  2   Para  1   Term  1   Preterm      AB      Living  1      SAB      IAB      Ectopic      Multiple      Live Births  1            Past Medical History:  Diagnosis Date   Allergy    Depression    Headache(784.0)    Panic attack     Past Surgical History:  Procedure Laterality Date   NO PAST SURGERIES      Family History  Problem Relation Age of Onset   Migraines Mother    Sickle cell trait Daughter    Migraines Maternal Grandmother     Social History   Tobacco Use   Smoking status: Never    Passive exposure: Yes   Smokeless tobacco: Never  Vaping Use   Vaping Use: Never used  Substance Use Topics   Alcohol use: No   Drug use: Not Currently    Types: Marijuana    Comment: approx 3 wks ago     Allergies:  Allergies  Allergen Reactions   Grass Pollen(K-O-R-T-Swt Vern) Hives   Latex Rash    Medications Prior to Admission  Medication Sig Dispense Refill Last Dose   Doxylamine-Pyridoxine (DICLEGIS PO) Take 10 mg by mouth 3 (three) times daily. One in am 2 at bedtime   12/24/2020   hydrOXYzine (ATARAX/VISTARIL) 25 MG tablet Take  25 mg by mouth 3 (three) times daily as needed.   Past Week   butalbital-acetaminophen-caffeine (FIORICET) 50-325-40 MG tablet Take 1 tablet by mouth 2 (two) times daily as needed for headache.      famotidine (PEPCID) 20 MG tablet Take 20 mg by mouth 2 (two) times daily.      lidocaine (XYLOCAINE) 2 % jelly Apply 1 application topically as needed. 30 mL 0    prenatal vitamin w/FE, FA (PRENATAL 1 + 1) 27-1 MG TABS tablet Take 1 tablet by mouth daily at 12 noon. (Patient not taking: Reported on 12/25/2020)   Not Taking   SUMAtriptan (IMITREX) 100 MG tablet Take 100 mg by mouth every 2 (two) hours as needed for migraine. May repeat in 2 hours if headache persists or recurs.       Review of Systems  Respiratory: Negative.    Gastrointestinal:  Positive for abdominal pain. Negative for constipation, diarrhea and nausea.  Genitourinary:  Negative for dysuria.  Physical Exam   Blood pressure 123/82, pulse 68, temperature 98.6 F (37 C), temperature source Oral,  resp. rate 17, height 4\' 11"  (1.499 m), weight 50.3 kg, SpO2 100 %, unknown if currently breastfeeding.  Physical Exam Constitutional:      Appearance: Normal appearance.  Abdominal:     General: Abdomen is flat.     Palpations: Abdomen is soft.  Musculoskeletal:        General: Normal range of motion.  Neurological:     Mental Status: She is alert.    MAU Course  Procedures  MDM FHR is 160 Patient is well-appearing, cheerful in bed and relieved to hear heatbeat. Abdominal exam benign.  Assessment and Plan 1. Indigestion   2. Dairy product intolerance      -long conversation with patient about her diet, which is low on fiber and vegetables and fruit and high in greasy foods, acidic foods and dairy. Since patient has just started consuming a lot of dairy, which is unusual for her, explained that she is probably reacting to the lactose. Discussed dietary modifications; patient states she wants to leave here and eat pizza. I suggested that she refrain or limit herself to one piece. She verbalized understandings.  -Explained that cord is too short to cause miscarriage at this point, explained that intercourse may also contribue to abdominal discomfort and pain.  -keep appt at [facility] on Tuesday; patient expressed gratitude for the care and relief that the baby is ok.  Friday Drayden Lukas 12/25/2020, 9:57 PM

## 2021-01-03 ENCOUNTER — Emergency Department (HOSPITAL_COMMUNITY): Payer: Medicaid Other

## 2021-01-03 ENCOUNTER — Other Ambulatory Visit: Payer: Self-pay

## 2021-01-03 ENCOUNTER — Emergency Department (HOSPITAL_COMMUNITY)
Admission: EM | Admit: 2021-01-03 | Discharge: 2021-01-03 | Disposition: A | Discharge status: Home / Self Care | Payer: Medicaid Other | Attending: Emergency Medicine | Admitting: Emergency Medicine

## 2021-01-03 ENCOUNTER — Encounter (HOSPITAL_COMMUNITY): Payer: Self-pay

## 2021-01-03 DIAGNOSIS — S99912A Unspecified injury of left ankle, initial encounter: Secondary | ICD-10-CM | POA: Diagnosis present

## 2021-01-03 DIAGNOSIS — S9032XA Contusion of left foot, initial encounter: Secondary | ICD-10-CM | POA: Diagnosis not present

## 2021-01-03 DIAGNOSIS — S20212A Contusion of left front wall of thorax, initial encounter: Secondary | ICD-10-CM | POA: Diagnosis not present

## 2021-01-03 DIAGNOSIS — S8002XA Contusion of left knee, initial encounter: Secondary | ICD-10-CM | POA: Insufficient documentation

## 2021-01-03 DIAGNOSIS — Z91040 Latex allergy status: Secondary | ICD-10-CM | POA: Diagnosis not present

## 2021-01-03 DIAGNOSIS — T1490XA Injury, unspecified, initial encounter: Secondary | ICD-10-CM

## 2021-01-03 DIAGNOSIS — S93402A Sprain of unspecified ligament of left ankle, initial encounter: Secondary | ICD-10-CM | POA: Diagnosis not present

## 2021-01-03 NOTE — Discharge Instructions (Addendum)
Your x-rays were negative for fracture.  Please take Tylenol 1000 mg every 6 hours for pain.  Cool compresses or ice compresses to your ankle and knee make sure you take plenty of deep breaths as this will help inflate your lungs and help prevent pneumonia from developing.  Drink plenty of water.  Follow-up with your primary care provider and obstetrician.
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
## 2021-01-03 NOTE — ED Notes (Signed)
Ortho tech notified to place aso lace-up ankle brace and bring crutches for pt.

## 2021-05-30 ENCOUNTER — Inpatient Hospital Stay (HOSPITAL_COMMUNITY)
Admission: AD | Admit: 2021-05-30 | Discharge: 2021-05-30 | Disposition: A | Discharge status: Home / Self Care | Payer: Medicaid Other | Attending: Obstetrics & Gynecology | Admitting: Obstetrics & Gynecology

## 2021-05-30 ENCOUNTER — Encounter (HOSPITAL_COMMUNITY): Payer: Self-pay | Admitting: Obstetrics & Gynecology

## 2021-05-30 DIAGNOSIS — R102 Pelvic and perineal pain: Secondary | ICD-10-CM | POA: Insufficient documentation

## 2021-05-30 DIAGNOSIS — Z3A35 35 weeks gestation of pregnancy: Secondary | ICD-10-CM | POA: Insufficient documentation

## 2021-05-30 DIAGNOSIS — O99891 Other specified diseases and conditions complicating pregnancy: Secondary | ICD-10-CM

## 2021-05-30 DIAGNOSIS — O26893 Other specified pregnancy related conditions, third trimester: Secondary | ICD-10-CM | POA: Diagnosis present

## 2021-05-30 NOTE — Progress Notes (Signed)
Written and verbal d/c instructions given and understanding voiced. 

## 2021-05-30 NOTE — MAU Note (Addendum)
Was in office today and did not have cervix checked. Would like to have cervix checked as she is having pelvic pressure. Some clear vag d/c but no VB or LOF.Pt gets care at [facility].

## 2021-05-30 NOTE — MAU Provider Note (Signed)
None     Chief Complaint:  pelvic pressure   Nina [name] is  20 y.o. G2P1001 at [redacted]w[redacted]d presents complaining of No chief complaint on file. .  Per RN:  Was in office today and did not have cervix checked. Would like to have cervix checked as she is having pelvic pressure. Some clear vag d/c but no VB or LOF.Pt gets care at [facility].   Obstetrical/Gynecological History: OB History     Gravida  2   Para  1   Term  1   Preterm      AB      Living  1      SAB      IAB      Ectopic      Multiple      Live Births  1          Past Medical History: Past Medical History:  Diagnosis Date   Allergy    Depression    Headache(784.0)    Panic attack     Past Surgical History: Past Surgical History:  Procedure Laterality Date   NO PAST SURGERIES      Family History: Family History  Problem Relation Age of Onset   Migraines Mother    Sickle cell trait Daughter    Migraines Maternal Grandmother     Social History: Social History   Tobacco Use   Smoking status: Never    Passive exposure: Yes   Smokeless tobacco: Never  Vaping Use   Vaping Use: Never used  Substance Use Topics   Alcohol use: No   Drug use: Not Currently    Types: Marijuana    Comment: approx 3 wks ago     Allergies:  Allergies  Allergen Reactions   Grass Pollen(K-O-R-T-Swt Vern) Hives   Latex Rash    Meds:  Medications Prior to Admission  Medication Sig Dispense Refill Last Dose   butalbital-acetaminophen-caffeine (FIORICET) 50-325-40 MG tablet Take 1 tablet by mouth 2 (two) times daily as needed for headache.      Doxylamine-Pyridoxine (DICLEGIS PO) Take 10 mg by mouth 3 (three) times daily. One in am 2 at bedtime      famotidine (PEPCID) 20 MG tablet Take 20 mg by mouth 2 (two) times daily.      hydrOXYzine (ATARAX/VISTARIL) 25 MG tablet Take 25 mg by mouth 3 (three) times daily as needed.      lidocaine (XYLOCAINE) 2 % jelly Apply 1 application topically  as needed. 30 mL 0    prenatal vitamin w/FE, FA (PRENATAL 1 + 1) 27-1 MG TABS tablet Take 1 tablet by mouth daily at 12 noon.      SUMAtriptan (IMITREX) 100 MG tablet Take 100 mg by mouth every 2 (two) hours as needed for migraine. May repeat in 2 hours if headache persists or recurs.       Review of Systems   Constitutional: Negative for fever and chills Eyes: Negative for visual disturbances Respiratory: Negative for shortness of breath, dyspnea Cardiovascular: Negative for chest pain or palpitations  Gastrointestinal: Negative for vomiting, diarrhea and constipation Genitourinary: Negative for dysuria and urgency Musculoskeletal: Negative for back pain, joint pain, myalgias.  Normal ROM  Neurological: Negative for dizziness and headaches    Physical Exam  Blood pressure 116/79, pulse 90, temperature 97.9 F (36.6 C), resp. rate 16, height 4\' 11"  (1.499 m), weight 63.5 kg, SpO2 100 %, unknown if currently breastfeeding. GENERAL: Well-developed, well-nourished female in no  acute distress.  LUNGS: Normal respiratory effort HEART: Regular rate and rhythm. ABDOMEN: Soft, nontender, nondistended, gravid.  EXTREMITIES: Nontender, no edema, 2+ distal pulses. DTR's 2+ CERVICAL EXAM: Dilatation 0cm   Effacement 0%   Station -3   Presentation: cephalic FHT:  Baseline rate 140 bpm   Variability moderate  Accelerations present   Decelerations none Contractions: Every 0 mins   Labs: No results found for this or any previous visit (from the past 24 hour(s)). Imaging Studies:  No results found.  Assessment: Nina [name] is  20 y.o. G2P1001 at [redacted]w[redacted]d presents with pelvic pressure, not in labor.  Plan: Pt reassured and DC'd.   Scarlette Calico Cresenzo-Dishmon 12/12/20229:50 PM

## 2021-06-25 ENCOUNTER — Encounter (HOSPITAL_COMMUNITY): Payer: Self-pay | Admitting: Obstetrics and Gynecology

## 2021-06-25 ENCOUNTER — Inpatient Hospital Stay (HOSPITAL_COMMUNITY)
Admission: AD | Admit: 2021-06-25 | Discharge: 2021-06-25 | Disposition: A | Discharge status: Home / Self Care | Payer: Medicaid Other | Attending: Obstetrics and Gynecology | Admitting: Obstetrics and Gynecology

## 2021-06-25 ENCOUNTER — Other Ambulatory Visit: Payer: Self-pay

## 2021-06-25 DIAGNOSIS — O26893 Other specified pregnancy related conditions, third trimester: Secondary | ICD-10-CM | POA: Diagnosis present

## 2021-06-25 DIAGNOSIS — Z3A39 39 weeks gestation of pregnancy: Secondary | ICD-10-CM

## 2021-06-25 DIAGNOSIS — Z0371 Encounter for suspected problem with amniotic cavity and membrane ruled out: Secondary | ICD-10-CM | POA: Diagnosis present

## 2021-06-25 LAB — POCT FERN TEST: POCT Fern Test: NEGATIVE

## 2021-06-25 NOTE — MAU Note (Signed)
Nina [name] is a 21 y.o. at [redacted]w[redacted]d here in MAU reporting: around midnight had some LOF. It was clear. Now she is feeling she is just having normal discharge. Having irregular contractions. No bleeding. +FM  Onset of complaint: today  Pain score: 2/10  Vitals:   06/25/21 1353  BP: 113/72  Pulse: 75  Resp: 16  Temp: 98.1 F (36.7 C)  SpO2: 100%     FHT:150  Lab orders placed from triage: none

## 2021-06-25 NOTE — MAU Provider Note (Signed)
Event Date/Time   First Provider Initiated Contact with Patient 06/25/21 1413       S: Ms. Nina [name] is a 21 y.o. G2P1001 at [redacted]w[redacted]d  who presents to MAU today complaining of leaking of fluid since midnight. She denies vaginal bleeding. She denies contractions. She reports normal fetal movement.    O: BP 113/76 (BP Location: Right Arm)    Pulse 76    Temp 98.1 F (36.7 C) (Oral)    Resp 15    Ht 5' (1.524 m)    Wt 64 kg    SpO2 100%    BMI 27.58 kg/m  GENERAL: Well-developed, well-nourished female in no acute distress.  HEAD: Normocephalic, atraumatic.  CHEST: Normal effort of breathing, regular heart rate ABDOMEN: Soft, nontender, gravid PELVIC: Normal external female genitalia. Vagina is pink and rugated. Cervix with normal contour, no lesions. Normal discharge.  No pooling.   Cervical exam:  Dilation: 1.5 Effacement (%): Thick Exam by:: Cleone Slim, CNM   Fetal Monitoring: Baseline: 130 Variability: moderate Accelerations: 15x15 Decelerations: none Contractions: UI  Results for orders placed or performed during the hospital encounter of 06/25/21 (from the past 24 hour(s))  POCT fern test     Status: None   Collection Time: 06/25/21  2:50 PM  Result Value Ref Range   POCT Fern Test Negative = intact amniotic membranes      A: [facility] at [redacted]w[redacted]d  Membranes intact  P: 1. Encounter for suspected premature rupture of amniotic membranes, with rupture of membranes not found   2. [redacted] weeks gestation of pregnancy    -Discharge home in stable condition -Labor precautions discussed -Patient advised to follow-up with OB as scheduled for prenatal care -Patient may return to MAU as needed or if her condition were to change or worsen   Rolm Bookbinder, [place] 06/25/2021 5:23 PM

## 2021-06-25 NOTE — Discharge Instructions (Signed)

## 2021-06-28 ENCOUNTER — Other Ambulatory Visit: Payer: Self-pay

## 2021-06-28 ENCOUNTER — Inpatient Hospital Stay (HOSPITAL_COMMUNITY)
Admission: AD | Admit: 2021-06-28 | Discharge: 2021-06-28 | Disposition: A | Discharge status: Home / Self Care | Payer: Medicaid Other | Attending: Obstetrics and Gynecology | Admitting: Obstetrics and Gynecology

## 2021-06-28 ENCOUNTER — Encounter (HOSPITAL_COMMUNITY): Payer: Self-pay | Admitting: Obstetrics and Gynecology

## 2021-06-28 DIAGNOSIS — O471 False labor at or after 37 completed weeks of gestation: Secondary | ICD-10-CM | POA: Insufficient documentation

## 2021-06-28 DIAGNOSIS — Z3A39 39 weeks gestation of pregnancy: Secondary | ICD-10-CM | POA: Insufficient documentation

## 2021-06-28 DIAGNOSIS — O479 False labor, unspecified: Secondary | ICD-10-CM | POA: Diagnosis not present

## 2021-06-28 NOTE — MAU Provider Note (Signed)
History  Chief Complaint:  Lost Mucous Plug and Contractions  Nina [name] is a 21 y.o. G89P1001 female at [redacted]w[redacted]d presenting w/ contractions q 7-84mins since this am, lost mucous plug. Had membrane sweep in office yesterday, was 4/50.  Called by RN to come evaluate pt b/c pt only wanted to be checked by a midwife.  Reports active fetal movement, contractions: q 7-72mins, vaginal bleeding: none, membranes: intact. Denies uti s/s, abnormal/malodorous vag d/c or vulvovaginal itching/irritation.   Prenatal care at [facility]. Does not want to deliver here  Obstetrical History: OB History     Gravida  2   Para  1   Term  1   Preterm      AB      Living  1      SAB      IAB      Ectopic      Multiple      Live Births  1           Past Medical History: Past Medical History:  Diagnosis Date   Allergy    Depression    Headache(784.0)    Panic attack     Past Surgical History: Past Surgical History:  Procedure Laterality Date   NO PAST SURGERIES      Social History: Social History   Socioeconomic History   Marital status: Single    Spouse name: Not on file   Number of children: Not on file   Years of education: Not on file   Highest education level: Not on file  Occupational History   Not on file  Tobacco Use   Smoking status: Never    Passive exposure: Yes   Smokeless tobacco: Never  Vaping Use   Vaping Use: Never used  Substance and Sexual Activity   Alcohol use: No   Drug use: Not Currently    Types: Marijuana    Comment: approx 3 wks ago    Sexual activity: Not Currently    Birth control/protection: None  Other Topics Concern   Not on file  Social History Narrative   Not on file   Social Determinants of Health   Financial Resource Strain: Not on file  Food Insecurity: Not on file  Transportation Needs: Not on file  Physical Activity: Not on file  Stress: Not on file  Social Connections: Not on file    Allergies: Allergies  Allergen  Reactions   Grass Pollen(K-O-R-T-Swt Vern) Hives   Latex Rash    Medications Prior to Admission  Medication Sig Dispense Refill Last Dose   Doxylamine-Pyridoxine (DICLEGIS PO) Take 10 mg by mouth 3 (three) times daily. One in am 2 at bedtime      famotidine (PEPCID) 20 MG tablet Take 20 mg by mouth 2 (two) times daily.      hydrOXYzine (ATARAX/VISTARIL) 25 MG tablet Take 25 mg by mouth 3 (three) times daily as needed.      prenatal vitamin w/FE, FA (PRENATAL 1 + 1) 27-1 MG TABS tablet Take 1 tablet by mouth daily at 12 noon.       Review of Systems  Pertinent pos/neg as indicated in HPI  Physical Exam  Blood pressure 114/73, pulse 96, temperature 98.3 F (36.8 C), temperature source Oral, resp. rate 18, height 5' (1.524 m), weight 63.7 kg, SpO2 100 %, unknown if currently breastfeeding. General appearance: alert and no distress Lungs: clear to auscultation bilaterally, normal effort Heart: regular rate and rhythm Abdomen: gravid, soft, non-tender  Dilation: 4 Effacement (%): 50 Cervical Position: Posterior Station: -3 Presentation: cephalic  Fetal monitoring: FHR: 140 bpm, variability: moderate,  Accelerations: Present,  decelerations:  Absent Uterine activity: irregular   MAU Course  NST & SVE Assessment and Plan  A:  [redacted]w[redacted]d [facility]  G2P1001  False labor  Cat 1 FHR P:  D/C home  Reviewed labor s/s, discussed if feels she is in labor and doesn't want to deliver here, go to [facility] where she does want to deliver   Cheral Marker CNM,WHNP-BC 1/10/20238:09 PM

## 2021-06-28 NOTE — MAU Note (Signed)
Presents with c/o ctxs 7-10 minutes apart since this morning and lost mucous plug.  Denies VB or LOF.  Endorses +FM.

## 2022-03-04 ENCOUNTER — Emergency Department (HOSPITAL_BASED_OUTPATIENT_CLINIC_OR_DEPARTMENT_OTHER)
Admission: EM | Admit: 2022-03-04 | Discharge: 2022-03-04 | Discharge status: Against Medical Advice | Payer: Medicaid Other | Attending: Emergency Medicine | Admitting: Emergency Medicine

## 2022-03-04 ENCOUNTER — Encounter (HOSPITAL_BASED_OUTPATIENT_CLINIC_OR_DEPARTMENT_OTHER): Payer: Self-pay

## 2022-03-04 DIAGNOSIS — Z5321 Procedure and treatment not carried out due to patient leaving prior to being seen by health care provider: Secondary | ICD-10-CM | POA: Insufficient documentation

## 2022-03-04 DIAGNOSIS — R079 Chest pain, unspecified: Secondary | ICD-10-CM | POA: Diagnosis present

## 2022-03-04 LAB — COMPREHENSIVE METABOLIC PANEL
ALT: 7 U/L (ref 0–44)
AST: 11 U/L — ABNORMAL LOW (ref 15–41)
Albumin: 4.4 g/dL (ref 3.5–5.0)
Alkaline Phosphatase: 48 U/L (ref 38–126)
Anion gap: 10 (ref 5–15)
BUN: 12 mg/dL (ref 6–20)
CO2: 23 mmol/L (ref 22–32)
Calcium: 9.5 mg/dL (ref 8.9–10.3)
Chloride: 106 mmol/L (ref 98–111)
Creatinine, Ser: 0.83 mg/dL (ref 0.44–1.00)
GFR, Estimated: >60 mL/min (ref >60)
Glucose, Bld: 121 mg/dL — ABNORMAL HIGH (ref 70–99)
Potassium: 3.6 mmol/L (ref 3.5–5.1)
Sodium: 139 mmol/L (ref 135–145)
Total Bilirubin: 2.3 mg/dL — ABNORMAL HIGH (ref 0.3–1.2)
Total Protein: 7.1 g/dL (ref 6.5–8.1)

## 2022-03-04 LAB — CBC WITH DIFFERENTIAL/PLATELET
Abs Immature Granulocytes: 0.01 10*3/uL (ref 0.00–0.07)
Basophils Absolute: 0.1 10*3/uL (ref 0.0–0.1)
Basophils Relative: 1 %
Eosinophils Absolute: 0.1 10*3/uL (ref 0.0–0.5)
Eosinophils Relative: 1 %
HCT: 38.9 % (ref 36.0–46.0)
Hemoglobin: 14.6 g/dL (ref 12.0–15.0)
Immature Granulocytes: 0 %
Lymphocytes Relative: 31 %
Lymphs Abs: 2 10*3/uL (ref 0.7–4.0)
MCH: 29.6 pg (ref 26.0–34.0)
MCHC: 37.5 g/dL — ABNORMAL HIGH (ref 30.0–36.0)
MCV: 78.7 fL — ABNORMAL LOW (ref 80.0–100.0)
Monocytes Absolute: 0.4 10*3/uL (ref 0.1–1.0)
Monocytes Relative: 6 %
Neutro Abs: 4 10*3/uL (ref 1.7–7.7)
Neutrophils Relative %: 61 %
Platelets: 303 10*3/uL (ref 150–400)
RBC: 4.94 MIL/uL (ref 3.87–5.11)
RDW: 12.9 % (ref 11.5–15.5)
WBC: 6.5 10*3/uL (ref 4.0–10.5)
nRBC: 0 % (ref 0.0–0.2)

## 2022-03-04 LAB — HCG, QUANTITATIVE, PREGNANCY: hCG, Beta Chain, Quant, S: <1 m[IU]/mL (ref <5)

## 2022-03-04 NOTE — ED Triage Notes (Signed)
Pt states that she has been having bleeding and large blood clots, noticed some pink tissue. Since yesterday. Pt is having cramps and nausea. Pt states she is on the nuvaring. Pt is unsure of how far along she is.

## 2022-04-15 ENCOUNTER — Encounter (HOSPITAL_COMMUNITY): Payer: Self-pay

## 2022-04-15 ENCOUNTER — Emergency Department (HOSPITAL_COMMUNITY)
Admission: EM | Admit: 2022-04-15 | Discharge: 2022-04-15 | Discharge status: Against Medical Advice | Payer: Medicaid Other | Attending: Emergency Medicine | Admitting: Emergency Medicine

## 2022-04-15 ENCOUNTER — Other Ambulatory Visit: Payer: Self-pay

## 2022-04-15 DIAGNOSIS — R1110 Vomiting, unspecified: Secondary | ICD-10-CM | POA: Insufficient documentation

## 2022-04-15 DIAGNOSIS — R531 Weakness: Secondary | ICD-10-CM | POA: Insufficient documentation

## 2022-04-15 DIAGNOSIS — Z5321 Procedure and treatment not carried out due to patient leaving prior to being seen by health care provider: Secondary | ICD-10-CM | POA: Diagnosis not present

## 2022-04-15 NOTE — ED Triage Notes (Addendum)
Patient has been vomiting for 2 days. Feeling weak. Said her anxiety is getting worse. Not taking any medication for anxiety. Said she does not think she is pregnant. Has a migraine but cannot take her medication due to breast feeding.

## 2022-04-26 ENCOUNTER — Emergency Department (HOSPITAL_COMMUNITY)
Admission: EM | Admit: 2022-04-26 | Discharge: 2022-04-26 | Discharge status: Against Medical Advice | Payer: Medicaid Other | Attending: Emergency Medicine | Admitting: Emergency Medicine

## 2022-04-26 ENCOUNTER — Other Ambulatory Visit: Payer: Self-pay

## 2022-04-26 ENCOUNTER — Encounter (HOSPITAL_COMMUNITY): Payer: Self-pay

## 2022-04-26 DIAGNOSIS — F419 Anxiety disorder, unspecified: Secondary | ICD-10-CM | POA: Insufficient documentation

## 2022-04-26 DIAGNOSIS — Z5321 Procedure and treatment not carried out due to patient leaving prior to being seen by health care provider: Secondary | ICD-10-CM | POA: Insufficient documentation

## 2022-04-26 NOTE — ED Triage Notes (Signed)
Pt reports she thinks she's having an anxiety attack and she's getting worked up, having N/V, headache (resolved). Symptoms ongoing for past week since being seen in ED.

## 2022-12-15 LAB — HM HEPATITIS C SCREENING LAB: HM Hepatitis Screen: NEGATIVE

## 2023-01-31 LAB — HM HIV SCREENING LAB: HM HIV Screening: NEGATIVE

## 2023-11-05 ENCOUNTER — Encounter (HOSPITAL_COMMUNITY): Payer: Self-pay | Admitting: Emergency Medicine

## 2023-11-05 ENCOUNTER — Other Ambulatory Visit: Payer: Self-pay

## 2023-11-05 ENCOUNTER — Emergency Department (HOSPITAL_COMMUNITY)
Admission: EM | Admit: 2023-11-05 | Discharge: 2023-11-06 | Disposition: A | Discharge status: Home / Self Care | Attending: Emergency Medicine | Admitting: Emergency Medicine

## 2023-11-05 DIAGNOSIS — U071 COVID-19: Secondary | ICD-10-CM | POA: Diagnosis not present

## 2023-11-05 DIAGNOSIS — R0981 Nasal congestion: Secondary | ICD-10-CM | POA: Diagnosis present

## 2023-11-05 DIAGNOSIS — Z91040 Latex allergy status: Secondary | ICD-10-CM | POA: Diagnosis not present

## 2023-11-05 DIAGNOSIS — J3489 Other specified disorders of nose and nasal sinuses: Secondary | ICD-10-CM

## 2023-11-05 LAB — RESP PANEL BY RT-PCR (RSV, FLU A&B, COVID)  RVPGX2
Influenza A by PCR: NEGATIVE
Influenza B by PCR: NEGATIVE
Resp Syncytial Virus by PCR: NEGATIVE
SARS Coronavirus 2 by RT PCR: POSITIVE — AB

## 2023-11-05 NOTE — ED Triage Notes (Signed)
 Patient c/o flu like symptoms x 2 days. Patient c/o bilateral ear pain.  Patient denies nausea/ vomiting.

## 2023-11-05 NOTE — ED Provider Notes (Signed)
 [place] EMERGENCY DEPARTMENT AT [facility] Provider Note   CSN: [id number] Arrival date & time: 11/05/23  2242     History  Chief Complaint  Patient presents with   Flu like symptoms    Nina [name] is a 23 y.o. female presents to the emergency department with a chief complaint of flulike symptoms.  Complaining of bilateral ear pain, denies nausea, vomiting.  Patient states that she began to have congestion yesterday.  She states that her congestion has gotten worse and that this morning and got to the point where she had severe sinus pressure, bilateral ear pressure, and trouble breathing out of her nose.  Patient also complaining of chest tightness and pain when taking a deep breath. Denies fever, but does have chills. Patient also states that roughly 4 days ago she was coughing in the bathroom and coughed up a blood clot, patient denies any other episodes of this since, but states she has continued to cough.   HPI     Home Medications Prior to Admission medications   Medication Sig Start Date End Date Taking? Authorizing Provider  Doxylamine-Pyridoxine (DICLEGIS PO) Take 10 mg by mouth 3 (three) times daily. One in am 2 at bedtime    [provider]  famotidine (PEPCID) 20 MG tablet Take 20 mg by mouth 2 (two) times daily.    [provider]  hydrOXYzine  (ATARAX /VISTARIL ) 25 MG tablet Take 25 mg by mouth 3 (three) times daily as needed.    [provider]  prenatal vitamin w/FE, FA (PRENATAL 1 + 1) 27-1 MG TABS tablet Take 1 tablet by mouth daily at 12 noon.    [provider]      Allergies    Grass pollen(k-o-r-t-swt vern) and Latex    Review of Systems   Review of Systems  Constitutional:  Positive for activity change, chills and fatigue. Negative for fever.  HENT:  Positive for congestion, postnasal drip, rhinorrhea, sinus pressure and sinus pain. Negative for ear discharge, ear pain and hearing loss.   Eyes:  Negative  for visual disturbance.  Respiratory:  Positive for cough and chest tightness. Negative for shortness of breath and wheezing.   Gastrointestinal:  Negative for abdominal pain, diarrhea, nausea and vomiting.  Neurological:  Positive for headaches. Negative for weakness.    Physical Exam Updated Vital Signs BP 113/89   Pulse 64   Temp 98 F (36.7 C)   Resp 16   Ht 5' (1.524 m)   Wt 53.1 kg   SpO2 100%   BMI 22.85 kg/m  Physical Exam Vitals and nursing note reviewed.  Constitutional:      General: She is awake. She is not in acute distress.    Appearance: Normal appearance. She is not ill-appearing, toxic-appearing or diaphoretic.  HENT:     Head: Normocephalic and atraumatic.     Right Ear: Hearing, tympanic membrane, ear canal and external ear normal.     Left Ear: Hearing and external ear normal.     Ears:     Comments: Left TM unable to be visualized due to wax     Nose: Congestion and rhinorrhea present.     Mouth/Throat:     Pharynx: Posterior oropharyngeal erythema present.  Eyes:     General: No scleral icterus.       Right eye: No discharge.        Left eye: No discharge.     Extraocular Movements: Extraocular movements intact.  Cardiovascular:  Rate and Rhythm: Normal rate and regular rhythm.  Pulmonary:     Breath sounds: Normal breath sounds.     Comments: No abnormal breath sounds appreciated on exam, however patient states she is unable to take deep breath due to chest tightness.  Abdominal:     General: Abdomen is flat.     Palpations: Abdomen is soft.  Musculoskeletal:     Cervical back: Normal range of motion.  Skin:    General: Skin is warm and dry.     Capillary Refill: Capillary refill takes less than 2 seconds.  Neurological:     General: No focal deficit present.     Mental Status: She is alert and oriented to person, place, and time.     Motor: No weakness.  Psychiatric:        Mood and Affect: Mood normal.        Behavior: Behavior  normal. Behavior is cooperative.     ED Results / Procedures / Treatments   Labs (all labs ordered are listed, but only abnormal results are displayed) Labs Reviewed  RESP PANEL BY RT-PCR (RSV, FLU A&B, COVID)  RVPGX2 - Abnormal; Notable for the following components:      Result Value   SARS Coronavirus 2 by RT PCR POSITIVE (*)    All other components within normal limits    EKG None  Radiology No results found.  Procedures Procedures    Medications Ordered in ED Medications - No data to display  ED Course/ Medical Decision Making/ A&P Clinical Course as of 11/06/23 0042  Tue Nov 06, 2023  0028 22 YOF with a chief complaint of fever/cough/congestion  COVID +   1x episode of hemoptysis 3 days    [CC]    Clinical Course User Index [CC] Onetha Bile, MD    Patient presents to the ED for concern of flu-like symptoms and bilateral ear pain, this involves an extensive number of treatment options, and is a complaint that carries with it a high risk of complications and morbidity.  The differential diagnosis includes flu, COVID, RSV, upper respiratory infection, bronchitis, pneumonia, etc.   Co morbidities that complicate the patient evaluation  none  Lab Tests:  I Ordered, and personally interpreted labs.  The pertinent results include: Coronavirus-positive   Medicines ordered and prescription drug management:  I ordered medication including Flonase, Zyrtec for sinus pressure, rhinorrhea, postnasal drip I have reviewed the patients home medicines and have made adjustments as needed   Test Considered:  CXR - no shortness of breath today on exam, vital signs stable, no indication currently with known coronavirus diagnosis  CBC - no current indication given positive COVID swab    Critical Interventions:  none   Problem List / ED Course:  Flu-like symptoms, bilateral ear pressure Patient experiencing flulike symptoms including congestion, sinus  pressure, rhinorrhea, postnasal drip, chest tightness, denies fever or shortness of breath  Vitals stable Positive COVID swab No acute distress, patient instructed to take medications as prescribed and use over-the-counter medications including Tylenol /ibuprofen  to help with pain/discomfort Patient agrees with this plan Return precautions given   Reevaluation:  After the interventions noted above, I reevaluated the patient and found that they have :stayed the same   Social Determinants of Health:  None   Dispostion:  After consideration of the diagnostic results and the patients response to treatment, I feel that the patent would benefit from discharge and outpatient therapy.  Patient instructed to follow-up with PCP, urgent care,  or return the emergency department if symptoms persist or worsen.  If you begin experiencing the following symptoms including but not limited to coughing up blood, chest pain/tightness, shortness of breath, return to the emergency department or seek further medical care. Please continue to rest, eat, and drink fluids.   Click here for ABCD2, HEART and other calculatorsREFRESH Note before signing :1}                              Medical Decision Making         Final Clinical Impression(s) / ED Diagnoses Final diagnoses:  None    Rx / DC Orders ED Discharge Orders     None         Fonda Hymen, PA-C 11/06/23 8657    Onetha Bile, MD 11/06/23 [phone]

## 2023-11-06 MED ORDER — FLUTICASONE PROPIONATE 50 MCG/ACT NA SUSP: 1.0000 | Freq: Every day | NASAL | 0 refills | Status: AC

## 2023-11-06 MED ORDER — CETIRIZINE HCL 10 MG PO TABS: 10.0000 mg | ORAL_TABLET | Freq: Every day | ORAL | 0 refills | Status: AC

## 2023-11-06 NOTE — Discharge Instructions (Addendum)
 It was a pleasure taking care of you today.  Today we evaluated the cause of your flu-like symptoms.  Based on your history, physical exam, and labs today you tested positive for COVID-19. COVID-19 is a virus so for your symptoms please use over the counter medicines like flo-nase nasal spray, zyrtec for congestion, tylenol /ibuprofen  for fever/pain. Please take these medications as prescribed.  If you continue to have breakthrough symptoms, please follow-up with your primary care provider, urgent care, or return to the emergency department.  If you begin experiencing the following symptoms including but not limited to coughing up blood, chest pain/tightness, shortness of breath, return to the emergency department or seek further medical care. Please continue to rest, eat, and drink fluids.

## 2024-03-20 ENCOUNTER — Encounter: Payer: Self-pay | Admitting: Nurse Practitioner

## 2024-03-20 ENCOUNTER — Ambulatory Visit (INDEPENDENT_AMBULATORY_CARE_PROVIDER_SITE_OTHER): Admitting: Nurse Practitioner

## 2024-03-20 VITALS — BP 124/88 | HR 60 | Temp 97.0°F | Ht 60.0 in | Wt 107.2 lb

## 2024-03-20 DIAGNOSIS — K3184 Gastroparesis: Secondary | ICD-10-CM | POA: Diagnosis not present

## 2024-03-20 DIAGNOSIS — G43009 Migraine without aura, not intractable, without status migrainosus: Secondary | ICD-10-CM | POA: Diagnosis not present

## 2024-03-20 DIAGNOSIS — F32A Depression, unspecified: Secondary | ICD-10-CM | POA: Insufficient documentation

## 2024-03-20 DIAGNOSIS — F419 Anxiety disorder, unspecified: Secondary | ICD-10-CM

## 2024-03-20 MED ORDER — RIZATRIPTAN BENZOATE 10 MG PO TABS: 10.0000 mg | ORAL_TABLET | ORAL | 1 refills | Status: AC | PRN

## 2024-03-20 MED ORDER — SUMATRIPTAN SUCCINATE 100 MG PO TABS: 100.0000 mg | ORAL_TABLET | ORAL | 2 refills | Status: DC | PRN

## 2024-03-20 NOTE — Assessment & Plan Note (Signed)
 Chronic, ongoing. She gets migraines about every 3-4 weeks. She states that she was taking sumatriptan, however feel like this is not helping as well anymore. Will have her switch to rizatriptan 10mg  daily as needed with a second dose 2 hours later if needed. Follow-up in 3 months or sooner with concerns.

## 2024-03-20 NOTE — Assessment & Plan Note (Signed)
 Chronic, ongoing. She states that her mood is overall ok, but would like to see another psychiatrist. She denies SI/HI. She is taking seroquel  100mg  daily at bedtime as needed. Referral placed to psychiatry.

## 2024-03-20 NOTE — Assessment & Plan Note (Signed)
 Chronic, ongoing. She is taking cyproheptadine 4mg  TID prn. She is following with GI at [facility], however would like to get a second opinion. Continue protonix 40mg  daily and zofran 4mg  every 8 hours as needed. Referral placed to GI.

## 2024-03-20 NOTE — Progress Notes (Signed)
 New Patient Visit  BP 124/88 (BP Location: Left Arm, Patient Position: Sitting, Cuff Size: Small)   Pulse 60   Temp (!) 97 F (36.1 C)   Ht 5' (1.524 m)   Wt 107 lb 3.2 oz (48.6 kg)   SpO2 99%   Breastfeeding No   BMI 20.94 kg/m    Subjective:    Patient ID: Rueben DELENA Ochs, female    DOB: December 01, 2000, 23 y.o.   MRN: [id number]  CC: Chief Complaint  Patient presents with   Establish Care    NP. Est. Care, no concerns    HPI: THANA RAMP is a 23 y.o. female presents for new patient visit to establish care.  Introduced to Publishing rights manager role and practice setting.  All questions answered.  Discussed provider/patient relationship and expectations.  She has a history of anxiety and depression. She states that she was taking seroquel , however has now only been taking this as needed for sleep. Overall, she states that her mood is doing well, but would like a referral to psychiatry.   She has a history of gastroparesis and states that her current GI is looking into Crohn's disease. She has intermittent abdominal pain, nausea, along with constipation and diarrhea. Sometimes she gest full early. She is taking cyproheptadine 4mg  TID prn and protonix 40mg  daily.   She has a history of migraines without aura. She states that she gets them every 3 weeks. She has imitrex, however feels like this isn't helping like it used to.      03/20/2024   10:15 AM  Depression screen PHQ 2/9  Decreased Interest 1  Down, Depressed, Hopeless 1  PHQ - 2 Score 2  Altered sleeping 3  Tired, decreased energy 1  Change in appetite 3  Feeling bad or failure about yourself  1  Trouble concentrating 2  Moving slowly or fidgety/restless 2  Suicidal thoughts 0  PHQ-9 Score 14  Difficult doing work/chores Very difficult      03/20/2024   10:15 AM  GAD 7 : Generalized Anxiety Score  Nervous, Anxious, on Edge 2  Control/stop worrying 0  Worry too much - different things 1  Trouble relaxing 1   Restless 0  Easily annoyed or irritable 3  Afraid - awful might happen 0  Total GAD 7 Score 7  Anxiety Difficulty Somewhat difficult    Past Medical History:  Diagnosis Date   Allergy    Depression    Eczema    Gastroparesis    Headache(784.0)    Panic attack     Past Surgical History:  Procedure Laterality Date   NO PAST SURGERIES      Family History  Problem Relation Age of Onset   Migraines Mother    Sickle cell trait Daughter    COPD Maternal Grandmother    Migraines Maternal Grandmother      Social History   Tobacco Use   Smoking status: Never    Passive exposure: Yes   Smokeless tobacco: Never  Vaping Use   Vaping status: Never Used  Substance Use Topics   Alcohol use: No   Drug use: Not Currently    Types: Marijuana    Current Outpatient Medications on File Prior to Visit  Medication Sig Dispense Refill   cyclobenzaprine  (FLEXERIL ) 5 MG tablet Take 5 mg by mouth 3 (three) times daily as needed for muscle spasms.     cyproheptadine (PERIACTIN) 4 MG tablet Take 4 mg by mouth 3 (three) times  daily as needed for allergies.     ferrous sulfate 325 (65 FE) MG tablet Take 325 mg by mouth daily with breakfast.     fluticasone  (FLONASE ) 50 MCG/ACT nasal spray Place 1 spray into both nostrils daily. 1 g 0   hydrOXYzine  (ATARAX /VISTARIL ) 25 MG tablet Take 25 mg by mouth 3 (three) times daily as needed.     levonorgestrel (MIRENA) 20 MCG/DAY IUD 1 each by Intrauterine route once.     lidocaine  (XYLOCAINE ) 5 % ointment Apply 1 Application topically as needed.     naproxen  (NAPROSYN ) 500 MG tablet Take 500 mg by mouth 2 (two) times daily with a meal.     ondansetron (ZOFRAN) 4 MG tablet Take 4 mg by mouth every 8 (eight) hours as needed for nausea or vomiting.     pantoprazole (PROTONIX) 20 MG tablet Take 20 mg by mouth daily. (Patient taking differently: Take 20 mg by mouth daily. 2 tablets in the morning)     QUEtiapine  (SEROQUEL ) 100 MG tablet Take 100 mg by  mouth at bedtime.     triamcinolone cream (KENALOG) 0.1 % Apply 1 Application topically 2 (two) times daily.     cetirizine  (ZYRTEC  ALLERGY) 10 MG tablet Take 1 tablet (10 mg total) by mouth daily. (Patient not taking: Reported on 03/20/2024) 30 tablet 0   prenatal vitamin w/FE, FA (PRENATAL 1 + 1) 27-1 MG TABS tablet Take 1 tablet by mouth daily at 12 noon. (Patient not taking: Reported on 03/20/2024)     No current facility-administered medications on file prior to visit.     Review of Systems  Constitutional:  Positive for fatigue. Negative for fever.  HENT: Negative.    Respiratory: Negative.    Cardiovascular: Negative.   Gastrointestinal:  Positive for abdominal pain (intermittent), constipation (alternates) and diarrhea (alternates).  Genitourinary: Negative.   Musculoskeletal: Negative.   Skin: Negative.   Neurological:  Positive for headaches. Negative for dizziness.  Psychiatric/Behavioral:  Positive for sleep disturbance. Negative for dysphoric mood. The patient is not nervous/anxious.         Objective:    BP 124/88 (BP Location: Left Arm, Patient Position: Sitting, Cuff Size: Small)   Pulse 60   Temp (!) 97 F (36.1 C)   Ht 5' (1.524 m)   Wt 107 lb 3.2 oz (48.6 kg)   SpO2 99%   Breastfeeding No   BMI 20.94 kg/m   Wt Readings from Last 3 Encounters:  03/20/24 107 lb 3.2 oz (48.6 kg)  11/05/23 117 lb (53.1 kg)  04/26/22 112 lb 7 oz (51 kg)    BP Readings from Last 3 Encounters:  03/20/24 124/88  11/05/23 113/89  04/26/22 (!) 135/101    Physical Exam Vitals and nursing note reviewed.  Constitutional:      General: She is not in acute distress.    Appearance: Normal appearance.  HENT:     Head: Normocephalic and atraumatic.  Eyes:     Conjunctiva/sclera: Conjunctivae normal.  Cardiovascular:     Rate and Rhythm: Normal rate and regular rhythm.     Pulses: Normal pulses.     Heart sounds: Normal heart sounds.  Pulmonary:     Effort: Pulmonary effort is  normal.     Breath sounds: Normal breath sounds.  Abdominal:     Palpations: Abdomen is soft.     Tenderness: There is no abdominal tenderness.  Musculoskeletal:        General: Normal range of motion.  Cervical back: Normal range of motion and neck supple.     Right lower leg: No edema.     Left lower leg: No edema.  Lymphadenopathy:     Cervical: No cervical adenopathy.  Skin:    General: Skin is warm and dry.  Neurological:     General: No focal deficit present.     Mental Status: She is alert and oriented to person, place, and time.     Cranial Nerves: No cranial nerve deficit.     Coordination: Coordination normal.     Gait: Gait normal.  Psychiatric:        Mood and Affect: Mood normal.        Behavior: Behavior normal.        Thought Content: Thought content normal.        Judgment: Judgment normal.        Assessment & Plan:   Problem List Items Addressed This Visit       Cardiovascular and Mediastinum   Migraine headache without aura   Chronic, ongoing. She gets migraines about every 3-4 weeks. She states that she was taking sumatriptan, however feel like this is not helping as well anymore. Will have her switch to rizatriptan 10mg  daily as needed with a second dose 2 hours later if needed. Follow-up in 3 months or sooner with concerns.       Relevant Medications   cyclobenzaprine  (FLEXERIL ) 5 MG tablet   naproxen  (NAPROSYN ) 500 MG tablet   rizatriptan (MAXALT) 10 MG tablet     Digestive   Gastroparesis - Primary   Chronic, ongoing. She is taking cyproheptadine 4mg  TID prn. She is following with [facility] at [facility], however would like to get a second opinion. Continue protonix 40mg  daily and zofran 4mg  every 8 hours as needed. Referral placed to GI.       Relevant Orders   Ambulatory referral to Gastroenterology     Other   Anxiety and depression   Chronic, ongoing. She states that her mood is overall ok, but would like to see another psychiatrist. She denies  SI/HI. She is taking seroquel  100mg  daily at bedtime as needed. Referral placed to psychiatry.       Relevant Orders   Ambulatory referral to Psychiatry     Follow up plan: Return in about 3 months (around 06/20/2024) for CPE.  Primus Gritton A Lanea Vankirk

## 2024-03-20 NOTE — Patient Instructions (Addendum)
 It was great to see you!  Let's switch to rizatriptan as needed for migraines  I have placed a referral to GI.   Let's follow-up in 3 months, sooner if you have concerns.  If a referral was placed today, you will be contacted for an appointment. Please note that routine referrals can sometimes take up to 3-4 weeks to process. Please call our office if you haven't heard anything after this time frame.  Take care,  Tinnie Harada, NP

## 2024-03-21 ENCOUNTER — Telehealth: Payer: Self-pay

## 2024-03-21 ENCOUNTER — Other Ambulatory Visit (HOSPITAL_COMMUNITY): Payer: Self-pay

## 2024-03-21 NOTE — Telephone Encounter (Signed)
 Can we get a prior auth on Rizatriptan10mg ?   Patient ID# [id number] M

## 2024-03-21 NOTE — Telephone Encounter (Signed)
 Request:           T/C   I have contacted the pts pharmacy twice today and both times I couldn't get through. However I have ran a test claim and the pts Insurance will only cover 12 tabs in a 23 day period of triptans. From my test claim it shows the patient received sumatriptan on 10/2. So therefore her insurance wouldn't pay for Rizatriptan at this moment.   Please see the attached

## 2024-03-21 NOTE — Telephone Encounter (Signed)
 Hello,           I have contacted the pts pharmacy twice today and both times I couldn't get through. However I have ran a test claim and the pts Insurance will only cover 12 tabs in a 23 day period of triptans. From my test claim it shows the patient received sumatriptan on 10/2. So therefore her insurance wouldn't pay for Rizatriptan at this moment.  Please see the attached

## 2024-03-24 NOTE — Telephone Encounter (Unsigned)
 Copied from CRM [id number]. Topic: Clinical - Medication Prior Auth >> Mar 24, 2024 10:40 AM [place] J wrote: Reason for CRM: Patient is returning a call from [place] is regards to the prior authorization on the Rizatriptan 10 MG. I relayed the message from [place], but the patient wanted to inform the clinic that she never received sumatriptan on 10/2 and is confused on how [place] got that information.  She would like a call back with more clarification.

## 2024-03-24 NOTE — Telephone Encounter (Signed)
 Left message for patient to return call.

## 2024-03-25 NOTE — Telephone Encounter (Signed)
 I tried to call patient and no voicemail to leave a message.

## 2024-03-26 NOTE — Telephone Encounter (Signed)
 I called [facility] pharmacy and spoke with pharmacist that said that patient had a Rx of Sumatriptan ready for pick up. I asked that she cancel that Rx and try to run the Rizatriptan and the prescription went through with no problem.

## 2024-03-26 NOTE — Telephone Encounter (Signed)
 I called and spoke with patient and she said that she has not picked up any triptan prescriptions at all.

## 2024-03-26 NOTE — Telephone Encounter (Signed)
 The information was regarding a (T/C)

## 2024-05-12 ENCOUNTER — Ambulatory Visit: Payer: Self-pay

## 2024-05-12 NOTE — Telephone Encounter (Signed)
 1st attempt to reach patient, VM not set up. Unable to leave VM  Copied from CRM #[id number]. Topic: Clinical - Medical Advice >> May 12, 2024  3:31 PM Frederich PARAS wrote: Reason for CRM: [facility] nurse  she says she called the pt to follow up with pt but pt adv her that she would like someone to call her, in regards to how she is feeling and follow ups. Please reach out to pt.

## 2024-05-12 NOTE — Telephone Encounter (Signed)
 2nd attempt, no answer, unable to leave message
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
## 2024-05-13 NOTE — Telephone Encounter (Signed)
 Noted has appointment. Dm/cma

## 2024-05-14 ENCOUNTER — Ambulatory Visit: Admitting: Nurse Practitioner

## 2024-05-14 ENCOUNTER — Encounter: Payer: Self-pay | Admitting: Nurse Practitioner

## 2024-05-14 VITALS — BP 116/66 | HR 72 | Temp 97.7°F | Ht 60.0 in | Wt 104.8 lb

## 2024-05-14 DIAGNOSIS — M542 Cervicalgia: Secondary | ICD-10-CM | POA: Diagnosis not present

## 2024-05-14 DIAGNOSIS — G43009 Migraine without aura, not intractable, without status migrainosus: Secondary | ICD-10-CM

## 2024-05-14 DIAGNOSIS — R55 Syncope and collapse: Secondary | ICD-10-CM | POA: Insufficient documentation

## 2024-05-14 MED ORDER — TRAMADOL HCL 50 MG PO TABS: 50.0000 mg | ORAL_TABLET | Freq: Three times a day (TID) | ORAL | 0 refills | Status: AC | PRN

## 2024-05-14 MED ORDER — GABAPENTIN 300 MG PO CAPS: 300.0000 mg | ORAL_CAPSULE | Freq: Every day | ORAL | 1 refills | Status: AC

## 2024-05-14 NOTE — Assessment & Plan Note (Signed)
 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 Continue sumatriptan  as needed. Advise on lifestyle modifications to reduce triggers, such as limiting screen time and ensuring hydration. She would like a referral to neurology, referral placed.

## 2024-05-14 NOTE — Patient Instructions (Addendum)
 It was great to see you!  We are checking your labs today and will let you know the results via mychart/phone.   Reach out to your chiropractor and schedule an appointment   Start tramadol  3 times a day as needed for severe pain   Start gabapentin  1 capsule at bedtime   I have placed a referral to a neurologist   Let's follow-up if symptoms worsen or any concerns   Take care,  Tinnie Harada, NP
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
## 2024-05-14 NOTE — Assessment & Plan Note (Signed)
 A recent syncope episode may relate to cervicalgia or post-concussion, with no prior history. Differential includes dehydration or systemic causes. Check CMP, CBC, TSH, free T4 today. Advise on maintaining hydration and monitoring for recurrence.

## 2024-05-15 LAB — COMPREHENSIVE METABOLIC PANEL WITH GFR
AG Ratio: 1.9 (calc) (ref 1.0–2.5)
ALT: 12 U/L (ref 6–29)
AST: 13 U/L (ref 10–30)
Albumin: 4.5 g/dL (ref 3.6–5.1)
Alkaline phosphatase (APISO): 53 U/L (ref 31–125)
BUN: 12 mg/dL (ref 7–25)
CO2: 27 mmol/L (ref 20–32)
Calcium: 9.3 mg/dL (ref 8.6–10.2)
Chloride: 105 mmol/L (ref 98–110)
Creat: 0.79 mg/dL (ref 0.50–0.96)
Globulin: 2.4 g/dL (ref 1.9–3.7)
Glucose, Bld: 97 mg/dL (ref 65–99)
Potassium: 4.1 mmol/L (ref 3.5–5.3)
Sodium: 141 mmol/L (ref 135–146)
Total Bilirubin: 1.4 mg/dL — ABNORMAL HIGH (ref 0.2–1.2)
Total Protein: 6.9 g/dL (ref 6.1–8.1)
eGFR: 107 mL/min/1.73m2 (ref >=60)

## 2024-05-15 LAB — CBC WITH DIFFERENTIAL/PLATELET
Absolute Lymphocytes: 1945 {cells}/uL (ref 850–3900)
Absolute Monocytes: 365 {cells}/uL (ref 200–950)
Basophils Absolute: 50 {cells}/uL (ref 0–200)
Basophils Relative: 1 %
Eosinophils Absolute: 80 {cells}/uL (ref 15–500)
Eosinophils Relative: 1.6 %
HCT: 44.7 % (ref 35.9–46.0)
Hemoglobin: 14.8 g/dL (ref 11.7–15.5)
MCH: 28.7 pg (ref 27.0–33.0)
MCHC: 33.1 g/dL (ref 31.6–35.4)
MCV: 86.8 fL (ref 81.4–101.7)
MPV: 9.7 fL (ref 7.5–12.5)
Monocytes Relative: 7.3 %
Neutro Abs: 2560 {cells}/uL (ref 1500–7800)
Neutrophils Relative %: 51.2 %
Platelets: 301 Thousand/uL (ref 140–400)
RBC: 5.15 Million/uL — ABNORMAL HIGH (ref 3.80–5.10)
RDW: 12 % (ref 11.0–15.0)
Total Lymphocyte: 38.9 %
WBC: 5 Thousand/uL (ref 3.8–10.8)

## 2024-05-15 LAB — T4, FREE: Free T4: 1.3 ng/dL (ref 0.8–1.8)

## 2024-05-15 LAB — TSH: TSH: 0.5 m[IU]/L

## 2024-05-19 ENCOUNTER — Ambulatory Visit: Payer: Self-pay | Admitting: Nurse Practitioner

## 2024-05-22 ENCOUNTER — Ambulatory Visit: Payer: Self-pay

## 2024-05-22 ENCOUNTER — Telehealth: Payer: Self-pay | Admitting: Nurse Practitioner

## 2024-05-22 DIAGNOSIS — R55 Syncope and collapse: Secondary | ICD-10-CM

## 2024-05-22 DIAGNOSIS — G43009 Migraine without aura, not intractable, without status migrainosus: Secondary | ICD-10-CM
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
## 2024-05-22 NOTE — Telephone Encounter (Signed)
 Copied from CRM [id number]. Topic: Referral - Question >> May 22, 2024  8:19 AM Robinson H wrote: Reason for CRM: Patient is calling to see if she can have an urgent referral sent to [facility] to possibly be seen sooner in Neurology, states [facility] is booked up and [facility] may be able to get her in sooner with an urgent referral. Patient states she's still having the migraines and needs to be soon in Neurology as soon as possible.  Dawnielle [phone]

## 2024-05-23 NOTE — Telephone Encounter (Signed)
 I called and spoke with patient and she said in order for the Neurology to get her in sooner that the referral needs to be changed to STAT.

## 2024-05-26 NOTE — Addendum Note (Signed)
 Addended by: Hendy Brindle A on: 05/26/2024 10:11 AM   Modules accepted: Orders

## 2024-06-02 ENCOUNTER — Ambulatory Visit: Admitting: Neurology

## 2024-06-02 ENCOUNTER — Encounter: Payer: Self-pay | Admitting: Neurology

## 2024-06-03 ENCOUNTER — Encounter: Payer: Self-pay | Admitting: Neurology

## 2024-06-03 ENCOUNTER — Ambulatory Visit: Admitting: Neurology

## 2024-06-03 ENCOUNTER — Telehealth: Payer: Self-pay | Admitting: Neurology

## 2024-06-03 ENCOUNTER — Other Ambulatory Visit: Payer: Self-pay

## 2024-06-03 ENCOUNTER — Ambulatory Visit (HOSPITAL_COMMUNITY): Admitting: Family

## 2024-06-03 VITALS — BP 116/63 | HR 71 | Ht 60.0 in | Wt 112.0 lb

## 2024-06-03 VITALS — BP 106/72 | HR 78 | Ht 60.0 in | Wt 113.0 lb

## 2024-06-03 DIAGNOSIS — G43701 Chronic migraine without aura, not intractable, with status migrainosus: Secondary | ICD-10-CM

## 2024-06-03 DIAGNOSIS — S098XXS Other specified injuries of head, sequela: Secondary | ICD-10-CM | POA: Diagnosis not present

## 2024-06-03 DIAGNOSIS — G43019 Migraine without aura, intractable, without status migrainosus: Secondary | ICD-10-CM

## 2024-06-03 MED ORDER — TOPIRAMATE 25 MG PO TABS: 25.0000 mg | ORAL_TABLET | Freq: Two times a day (BID) | ORAL | 2 refills | Status: AC

## 2024-06-03 MED ORDER — NURTEC 75 MG PO TBDP: ORAL_TABLET | ORAL | 2 refills | Status: AC
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
## 2024-06-03 NOTE — Progress Notes (Unsigned)
 Patient arrived and was unhappy from the moment she got back to my office. Patient gave vague answers and made it clear she was not happy answering any of my questions. Patient did a lot of sighing and eye rolling.I advised the provider of her behavior
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
## 2024-06-03 NOTE — Telephone Encounter (Signed)
MRI order sent to [place] [phone]

## 2024-06-20 ENCOUNTER — Encounter: Admitting: Nurse Practitioner

## 2024-06-26 ENCOUNTER — Ambulatory Visit: Payer: Self-pay | Admitting: Nurse Practitioner

## 2024-06-26 ENCOUNTER — Encounter: Payer: Self-pay | Admitting: Nurse Practitioner

## 2024-06-26 VITALS — BP 118/80 | HR 66 | Temp 97.0°F | Ht 60.0 in | Wt 111.4 lb

## 2024-06-26 DIAGNOSIS — F332 Major depressive disorder, recurrent severe without psychotic features: Secondary | ICD-10-CM

## 2024-06-26 DIAGNOSIS — Z0001 Encounter for general adult medical examination with abnormal findings: Secondary | ICD-10-CM

## 2024-06-26 DIAGNOSIS — G4700 Insomnia, unspecified: Secondary | ICD-10-CM

## 2024-06-26 DIAGNOSIS — J069 Acute upper respiratory infection, unspecified: Secondary | ICD-10-CM

## 2024-06-26 DIAGNOSIS — G43009 Migraine without aura, not intractable, without status migrainosus: Secondary | ICD-10-CM | POA: Diagnosis not present

## 2024-06-26 DIAGNOSIS — Z0000 Encounter for general adult medical examination without abnormal findings: Secondary | ICD-10-CM

## 2024-06-26 MED ORDER — PROMETHAZINE-DM 6.25-15 MG/5ML PO SYRP: 5.0000 mL | ORAL_SOLUTION | Freq: Four times a day (QID) | ORAL | 0 refills | Status: AC | PRN

## 2024-06-26 NOTE — Progress Notes (Signed)
 "  BP 118/80 (BP Location: Left Arm, Patient Position: Sitting, Cuff Size: Small)   Pulse 66   Temp (!) 97 F (36.1 C)   Ht 5' (1.524 m)   Wt 111 lb 6.4 oz (50.5 kg)   SpO2 98%   BMI 21.76 kg/m    Subjective:    Patient ID: Nina [name], female    DOB: 2000/12/17, 23 y.o.   MRN: [id number]  CC: Chief Complaint  Patient presents with   Annual Exam    Concerns with cough and runny nose    HPI: Nina [name] is a 24 y.o. female presenting on 06/26/2024 for comprehensive medical examination. Current medical complaints include:cough  Discussed the use of AI scribe software for clinical note transcription with the patient, who gave verbal consent to proceed.  She has had a productive cough with mucus and occasional hemoptysis since Sunday or Monday. The cough is worse at night, disrupts sleep, and causes fatigue. She has been using Theraflu intermittently and increasing fluids with water and Gatorade.  She has chest pain that worsens with deep breaths, along with headaches, tinnitus, and difficulty hearing, which she links to the current illness.  She notes urinary frequency and urgency and can urinate more by pressing on her stomach. This started after having children.   She is managing depression and sleep with mirtazapine  and is unhappy with a prior behavioral health visit. She previously used Seroquel  and Risperdal  for mood stabilization and is concerned about multiple medications and sedative side effects.  She has chronic neck and back pain and uses gabapentin  occasionally. Sumatriptan  and rizatriptan  no longer help her headaches, and she is cautious about trying new medications due to prior side effects.      She currently lives with: husband, kids Menopausal Symptoms: no  Depression and Anxiety Screen done today and results listed below:     06/26/2024    2:39 PM 06/03/2024    2:19 PM 03/20/2024   10:15 AM  Depression screen PHQ 2/9  Decreased Interest 1 2 1   Down,  Depressed, Hopeless 0 2 1  PHQ - 2 Score 1 4 2   Altered sleeping 0 2 3  Tired, decreased energy 1 2 1   Change in appetite 3 2 3   Feeling bad or failure about yourself  0 1 1  Trouble concentrating 3 2 2   Moving slowly or fidgety/restless 0 0 2  Suicidal thoughts 0 0 0  PHQ-9 Score 8 13 14    Difficult doing work/chores Somewhat difficult  Very difficult     Data saved with a previous flowsheet row definition      06/26/2024    2:42 PM 03/20/2024   10:15 AM  GAD 7 : Generalized Anxiety Score  Nervous, Anxious, on Edge 1 2  Control/stop worrying 3 0  Worry too much - different things 3 1  Trouble relaxing 1 1  Restless 0 0  Easily annoyed or irritable 3 3  Afraid - awful might happen 0 0  Total GAD 7 Score 11 7  Anxiety Difficulty Somewhat difficult Somewhat difficult    The patient does not have a history of falls. I did not complete a risk assessment for falls. A plan of care for falls was not documented.   Past Medical History:  Past Medical History:  Diagnosis Date   Allergy    Depression    Eczema    Gastroparesis    Headache(784.0)    Panic attack     Surgical  History:  Past Surgical History:  Procedure Laterality Date   NO PAST SURGERIES      Medications:  Current Outpatient Medications on File Prior to Visit  Medication Sig   cetirizine  (ZYRTEC  ALLERGY) 10 MG tablet Take 1 tablet (10 mg total) by mouth daily.   cyclobenzaprine  (FLEXERIL ) 5 MG tablet Take 5 mg by mouth 3 (three) times daily as needed for muscle spasms.   cyproheptadine (PERIACTIN) 4 MG tablet Take 4 mg by mouth 3 (three) times daily as needed for allergies.   ferrous sulfate 325 (65 FE) MG tablet Take 325 mg by mouth daily with breakfast.   fluticasone  (FLONASE ) 50 MCG/ACT nasal spray Place 1 spray into both nostrils daily.   gabapentin  (NEURONTIN ) 300 MG capsule Take 1 capsule (300 mg total) by mouth at bedtime.   hydrOXYzine  (ATARAX /VISTARIL ) 25 MG tablet Take 25 mg by mouth 3 (three)  times daily as needed.   levonorgestrel (MIRENA) 20 MCG/DAY IUD 1 each by Intrauterine route once.   lidocaine  (XYLOCAINE ) 5 % ointment Apply 1 Application topically as needed.   mirtazapine  (REMERON ) 7.5 MG tablet Take 7.5 mg by mouth at bedtime.   naproxen  (NAPROSYN ) 500 MG tablet Take 500 mg by mouth 2 (two) times daily with a meal.   ondansetron (ZOFRAN) 4 MG tablet Take 4 mg by mouth every 8 (eight) hours as needed for nausea or vomiting.   pantoprazole (PROTONIX) 20 MG tablet Take 20 mg by mouth daily.   prenatal vitamin w/FE, FA (PRENATAL 1 + 1) 27-1 MG TABS tablet Take 1 tablet by mouth daily at 12 noon.   QUEtiapine  (SEROQUEL ) 100 MG tablet Take 100 mg by mouth at bedtime.   Rimegepant Sulfate (NURTEC) 75 MG TBDP Take at onset of migraine po 1 tab and may repeat once in 2 hours. Do not exceed 2 in 48 hours.   rizatriptan  (MAXALT ) 10 MG tablet Take 1 tablet (10 mg total) by mouth as needed for migraine. May repeat in 2 hours if needed   topiramate  (TOPAMAX ) 25 MG tablet Take 1 tablet (25 mg total) by mouth 2 (two) times daily.   triamcinolone cream (KENALOG) 0.1 % Apply 1 Application topically 2 (two) times daily.   No current facility-administered medications on file prior to visit.    Allergies:  Allergies[1]  Social History:  Social History   Socioeconomic History   Marital status: Single    Spouse name: Not on file   Number of children: Not on file   Years of education: Not on file   Highest education level: Not on file  Occupational History   Not on file  Tobacco Use   Smoking status: Never    Passive exposure: Yes   Smokeless tobacco: Never  Vaping Use   Vaping status: Never Used  Substance and Sexual Activity   Alcohol use: No   Drug use: Not Currently    Types: Marijuana   Sexual activity: Not Currently    Birth control/protection: I.U.D.  Other Topics Concern   Not on file  Social History Narrative   Pt lives alone    Pt doesn't work    Social Drivers  of Health   Tobacco Use: Medium Risk (06/26/2024)   Patient History    Smoking Tobacco Use: Never    Smokeless Tobacco Use: Never    Passive Exposure: Yes  Financial Resource Strain: Not on file  Food Insecurity: Low Risk (06/10/2024)   Received [facility]  Within the past 12 months, you worried that your food would run out before you got money to buy more: Never true    Within the past 12 months, the food you bought just didn't last and you didn't have money to get more. : Never true  Recent Concern: Food Insecurity - Medium Risk (04/23/2024)   Received from [facility]   Epic    Within the past 12 months, you worried that your food would run out before you got money to buy more: Sometimes true    Within the past 12 months, the food you bought just didn't last and you didn't have money to get more. : Sometimes true  Transportation Needs: No Transportation Needs (06/10/2024)   Received from [facility]    In the past 12 months, has lack of reliable transportation kept you from medical appointments, meetings, work or from getting things needed for daily living? : No  Physical Activity: Not on file  Stress: Not on file  Social Connections: Not on file  Intimate Partner Violence: At Risk (06/30/2021)   Received from [facility] visits prior to 08/19/2022.   Epic    Within the last year, have you been afraid of your partner or ex-partner?: No    Within the last year, have you been humiliated or emotionally abused in other ways by your partner or ex-partner?: Yes    Within the last year, have you been kicked, hit, slapped, or otherwise physically hurt by your partner or ex-partner?: No    Within the last year, have you been raped or forced to have any kind of sexual activity by your partner or ex-partner?: No  Depression (PHQ2-9): Medium Risk (06/26/2024)   Depression (PHQ2-9)    PHQ-2 Score: 8  Alcohol Screen: Not on file  Housing:  Low Risk (06/10/2024)   Received from [facility]   Epic    What is your living situation today?: I have a steady place to live    Think about the place you live. Do you have problems with any of the following? Choose all that apply:: None/None on this list  Utilities: Low Risk (06/10/2024)   Received from [facility]   Utilities    In the past 12 months has the electric, gas, oil, or water company threatened to shut off services in your home? : No  Recent Concern: Utilities - Medium Risk (04/23/2024)   Received from [facility]   Utilities    In the past 12 months has the electric, gas, oil, or water company threatened to shut off services in your home? : Yes  Health Literacy: Not on file   Tobacco Use History[2] Social History   Substance and Sexual Activity  Alcohol Use No    Family History:  Family History  Problem Relation Age of Onset   Migraines Mother    Sickle cell trait Daughter    COPD Maternal Grandmother    Migraines Maternal Grandmother     Past medical history, surgical history, medications, allergies, family history and social history reviewed with patient today and changes made to appropriate areas of the chart.   Review of Systems  Constitutional: Negative.   HENT:  Positive for congestion. Negative for ear discharge and sore throat.   Eyes: Negative.   Respiratory:  Positive for cough and shortness of breath (at times).   Cardiovascular: Negative.   Gastrointestinal: Negative.   Genitourinary:  Positive for urgency. Negative for dysuria  and frequency.  Musculoskeletal: Negative.   Skin: Negative.   Neurological: Negative.   Psychiatric/Behavioral: Negative.     All other ROS negative except what is listed above and in the HPI.      Objective:    BP 118/80 (BP Location: Left Arm, Patient Position: Sitting, Cuff Size: Small)   Pulse 66   Temp (!) 97 F (36.1 C)   Ht 5' (1.524 m)   Wt 111 lb 6.4 oz (50.5 kg)   SpO2 98%   BMI 21.76 kg/m    Wt Readings from Last 3 Encounters:  06/26/24 111 lb 6.4 oz (50.5 kg)  06/03/24 113 lb (51.3 kg)  06/03/24 112 lb (50.8 kg)    Physical Exam Vitals and nursing note reviewed.  Constitutional:      General: She is not in acute distress.    Appearance: Normal appearance.  HENT:     Head: Normocephalic and atraumatic.     Right Ear: Tympanic membrane, ear canal and external ear normal.     Left Ear: Tympanic membrane, ear canal and external ear normal.     Mouth/Throat:     Mouth: Mucous membranes are moist.     Pharynx: No posterior oropharyngeal erythema.  Eyes:     Conjunctiva/sclera: Conjunctivae normal.  Cardiovascular:     Rate and Rhythm: Normal rate and regular rhythm.     Pulses: Normal pulses.     Heart sounds: Normal heart sounds.  Pulmonary:     Effort: Pulmonary effort is normal.     Breath sounds: Normal breath sounds.  Abdominal:     Palpations: Abdomen is soft.     Tenderness: There is no abdominal tenderness.  Musculoskeletal:        General: Normal range of motion.     Cervical back: Normal range of motion and neck supple.     Right lower leg: No edema.     Left lower leg: No edema.  Lymphadenopathy:     Cervical: No cervical adenopathy.  Skin:    General: Skin is warm and dry.  Neurological:     General: No focal deficit present.     Mental Status: She is alert and oriented to person, place, and time.     Cranial Nerves: No cranial nerve deficit.     Coordination: Coordination normal.     Gait: Gait normal.  Psychiatric:        Mood and Affect: Mood normal.        Behavior: Behavior normal.        Thought Content: Thought content normal.        Judgment: Judgment normal.     Results for orders placed or performed in visit on 05/14/24  CBC with Differential/Platelet   Collection Time: 05/14/24  4:41 PM  Result Value Ref Range   WBC 5.0 3.8 - 10.8 Thousand/uL   RBC 5.15 (H) 3.80 - 5.10 Million/uL   Hemoglobin 14.8 11.7 - 15.5 g/dL   HCT 55.2  64.0 - 53.9 %   MCV 86.8 81.4 - 101.7 fL   MCH 28.7 27.0 - 33.0 pg   MCHC 33.1 31.6 - 35.4 g/dL   RDW 87.9 88.9 - 84.9 %   Platelets 301 140 - 400 Thousand/uL   MPV 9.7 7.5 - 12.5 fL   Neutro Abs 2,560 1,500 - 7,800 cells/uL   Absolute Lymphocytes 1,945 850 - 3,900 cells/uL   Absolute Monocytes 365 200 - 950 cells/uL   Eosinophils Absolute 80 15 -  500 cells/uL   Basophils Absolute 50 0 - 200 cells/uL   Neutrophils Relative % 51.2 %   Total Lymphocyte 38.9 %   Monocytes Relative 7.3 %   Eosinophils Relative 1.6 %   Basophils Relative 1.0 %  Comprehensive metabolic panel with GFR   Collection Time: 05/14/24  4:41 PM  Result Value Ref Range   Glucose, Bld 97 65 - 99 mg/dL   BUN 12 7 - 25 mg/dL   Creat 9.20 9.49 - 9.03 mg/dL   eGFR 892 > OR = 60 fO/fpw/8.26f7   BUN/Creatinine Ratio SEE NOTE: 6 - 22 (calc)   Sodium 141 135 - 146 mmol/L   Potassium 4.1 3.5 - 5.3 mmol/L   Chloride 105 98 - 110 mmol/L   CO2 27 20 - 32 mmol/L   Calcium 9.3 8.6 - 10.2 mg/dL   Total Protein 6.9 6.1 - 8.1 g/dL   Albumin 4.5 3.6 - 5.1 g/dL   Globulin 2.4 1.9 - 3.7 g/dL (calc)   AG Ratio 1.9 1.0 - 2.5 (calc)   Total Bilirubin 1.4 (H) 0.2 - 1.2 mg/dL   Alkaline phosphatase (APISO) 53 31 - 125 U/L   AST 13 10 - 30 U/L   ALT 12 6 - 29 U/L  TSH   Collection Time: 05/14/24  4:41 PM  Result Value Ref Range   TSH 0.50 mIU/L  T4, free   Collection Time: 05/14/24  4:41 PM  Result Value Ref Range   Free T4 1.3 0.8 - 1.8 ng/dL      Assessment & Plan:   Problem List Items Addressed This Visit       Cardiovascular and Mediastinum   Migraine headache without aura   Chronic migraines are managed with Topamax  and Nurtec, but current medications are ineffective. Rizatriptan  causes nausea and is ineffective. Continue Topamax  25mg  BID and Nurtec 75mg  every other day for prevention. Continue collaboration and recommendations from neurology.      Relevant Medications   mirtazapine  (REMERON ) 7.5 MG tablet      Other   Insomnia   Referral placed to psychiatry.       MDD (major depressive disorder), recurrent episode, severe (HCC)   She experiences recurrent severe depression with mood swings. A previous behavioral health referral was unsatisfactory, and there are concerns about medication management and side effects. She prefers non-sedative options for mood stabilization. Refer to a psychiatrist for medication management and to a therapist for counseling and support.      Relevant Medications   mirtazapine  (REMERON ) 7.5 MG tablet   Other Relevant Orders   Ambulatory referral to Psychiatry   Other Visit Diagnoses       Encounter for general adult medical examination with abnormal findings    -  Primary   Health maintenance reviewed and updated. Discussed nutrition and exercise.     Upper respiratory tract infection, unspecified type       Most likely viral. Encourage fluids, rest. start promethazine  dm every 4 hours as needed for cough. F/U if not improving.        Follow up plan: Return in about 6 months (around 12/24/2024) for follow-up.   LABORATORY TESTING:  - Pap smear: up to date  IMMUNIZATIONS:   - Tdap: Tetanus vaccination status reviewed: last tetanus booster within 10 years. - Influenza: Declined - Pneumovax: Not applicable - Prevnar: Not applicable - HPV: Up to date - Shingrix vaccine: Not applicable  SCREENING: -Mammogram: Not applicable  - Colonoscopy: Not applicable  -  Bone Density: Not applicable   PATIENT COUNSELING:   Advised to take 1 mg of folate supplement per day if capable of pregnancy.   Sexuality: Discussed sexually transmitted diseases, partner selection, use of condoms, avoidance of unintended pregnancy  and contraceptive alternatives.   Advised to avoid cigarette smoking.  I discussed with the patient that most people either abstain from alcohol or drink within safe limits (<=14/week and <=4 drinks/occasion for males, <=7/weeks and <= 3  drinks/occasion for females) and that the risk for alcohol disorders and other health effects rises proportionally with the number of drinks per week and how often a drinker exceeds daily limits.  Discussed cessation/primary prevention of drug use and availability of treatment for abuse.   Diet: Encouraged to adjust caloric intake to maintain  or achieve ideal body weight, to reduce intake of dietary saturated fat and total fat, to limit sodium intake by avoiding high sodium foods and not adding table salt, and to maintain adequate dietary potassium and calcium preferably from fresh fruits, vegetables, and low-fat dairy products.    stressed the importance of regular exercise  Injury prevention: Discussed safety belts, safety helmets, smoke detector, smoking near bedding or upholstery.   Dental health: Discussed importance of regular tooth brushing, flossing, and dental visits.    NEXT PREVENTATIVE PHYSICAL DUE IN 1 YEAR. Return in about 6 months (around 12/24/2024) for follow-up.  Masiah Lewing A Azhar Knope     [1]  Allergies Allergen Reactions   Iodinated Contrast Media Shortness Of Breath   Grass Pollen(K-O-[id number]-T-Swt Vern) Hives   Latex Rash  [2]  Social History Tobacco Use  Smoking Status Never   Passive exposure: Yes  Smokeless Tobacco Never   "

## 2024-06-26 NOTE — Patient Instructions (Signed)
 It was great to see you!  Start promethazine  dm cough syrup every 4 hours as needed  Keep drinking plenty of fluids   I have placed a referral - I am trying to find one with both therapy and psychiatry in same office  [facility] is open 24/7 and is a walk-in urgent care  [facility] [address], [place], [place] [address] [phone]  Let's follow-up in 6 months, sooner if you have concerns.  If a referral was placed today, you will be contacted for an appointment. Please note that routine referrals can sometimes take up to 3-4 weeks to process. Please call our office if you haven't heard anything after this time frame.  Take care,  Tinnie Harada, NP

## 2024-06-27 NOTE — Assessment & Plan Note (Signed)
 She experiences recurrent severe depression with mood swings. A previous behavioral health referral was unsatisfactory, and there are concerns about medication management and side effects. She prefers non-sedative options for mood stabilization. Refer to a psychiatrist for medication management and to a therapist for counseling and support.

## 2024-06-27 NOTE — Assessment & Plan Note (Signed)
 Referral placed to psychiatry

## 2024-06-27 NOTE — Assessment & Plan Note (Signed)
 Chronic migraines are managed with Topamax  and Nurtec, but current medications are ineffective. Rizatriptan  causes nausea and is ineffective. Continue Topamax  25mg  BID and Nurtec 75mg  every other day for prevention. Continue collaboration and recommendations from neurology.

## 2024-06-30 ENCOUNTER — Encounter: Payer: Self-pay | Admitting: Neurology

## 2024-07-08 ENCOUNTER — Inpatient Hospital Stay: Admission: RE | Admit: 2024-07-08 | Source: Ambulatory Visit

## 2024-07-08 ENCOUNTER — Telehealth: Payer: Self-pay

## 2024-07-08 NOTE — Telephone Encounter (Signed)
 Please see message below  LOV: 06/26/2024

## 2024-07-08 NOTE — Telephone Encounter (Signed)
 Copied from CRM #[id number]. Topic: Clinical - Medication Question >> Jul 08, 2024 11:46 AM Burnard DEL wrote: Reason for CRM: Patient called in stating that she got her tooth pulled however the medications that she is needing the dentist that pulled her tooth can prescribed because he isn't covered under her insurance. She would like to know if PCP could prescribe her  CHLORHEXIDINE  0.12 oral rinse 473 ml and CLINDAMYCIN  300MG  antibiotic?  [id number] - [place], [place] - [address] AT [facility] [address]  Phone: [phone] Fax: [phone]

## 2024-07-09 MED ORDER — CLINDAMYCIN HCL 300 MG PO CAPS: 300.0000 mg | ORAL_CAPSULE | Freq: Three times a day (TID) | ORAL | 0 refills | Status: AC

## 2024-07-09 MED ORDER — CHLORHEXIDINE GLUCONATE 0.12 % MT SOLN: 15.0000 mL | Freq: Three times a day (TID) | OROMUCOSAL | 0 refills | Status: AC

## 2024-07-09 NOTE — Telephone Encounter (Signed)
 Copied from CRM #[id number]. Topic: Clinical - Medication Question >> Jul 08, 2024  5:21 PM Charolett L wrote: Reason for CRM: Patient called in to check the status of medications CHLORHEXIDINE  0.12 oral rinse 473 ml and CLINDAMYCIN  300MG  antibiotic?

## 2024-07-09 NOTE — Telephone Encounter (Signed)
 I called and spoke with patient and she has picked up medication.

## 2024-07-09 NOTE — Telephone Encounter (Signed)
 I called and spoke with pharmacy tech and CHLORHEXIDINE  0.12 oral rinse 473 ml was prescribed for 1 cap swish for 30 seconds 3 times a day and CLINDAMYCIN  300MG  antibiotic 1 capsule 3 times a day qty of 9 and Tylenol  #3 qty of 8 tablets not covered.

## 2024-07-09 NOTE — Telephone Encounter (Signed)
"  Pharmacy opens at 9am.   "

## 2024-08-02 ENCOUNTER — Other Ambulatory Visit

## 2024-12-23 ENCOUNTER — Ambulatory Visit: Admitting: Nurse Practitioner

## 2025-01-01 ENCOUNTER — Ambulatory Visit: Admitting: Adult Health
